# Patient Record
Sex: Female | Born: 1935 | Race: White | Hispanic: No | Smoking: Never smoker
Health system: Southern US, Community
[De-identification: ages and names within clinical notes are randomized; demographics above are authoritative.]

---

## 2018-11-08 ENCOUNTER — Emergency Department: Payer: Medicare Other

## 2018-11-08 ENCOUNTER — Inpatient Hospital Stay
Admission: EM | Admit: 2018-11-08 | Discharge: 2018-11-12 | DRG: 481 | Disposition: A | Discharge status: Home Health | Payer: Medicare Other | Attending: Internal Medicine | Admitting: Internal Medicine

## 2018-11-08 ENCOUNTER — Other Ambulatory Visit: Payer: Self-pay

## 2018-11-08 ENCOUNTER — Encounter: Payer: Self-pay | Admitting: *Deleted

## 2018-11-08 DIAGNOSIS — I10 Essential (primary) hypertension: Secondary | ICD-10-CM | POA: Diagnosis present

## 2018-11-08 DIAGNOSIS — F0390 Unspecified dementia without behavioral disturbance: Secondary | ICD-10-CM | POA: Diagnosis present

## 2018-11-08 DIAGNOSIS — D62 Acute posthemorrhagic anemia: Secondary | ICD-10-CM | POA: Diagnosis not present

## 2018-11-08 DIAGNOSIS — R739 Hyperglycemia, unspecified: Secondary | ICD-10-CM | POA: Diagnosis present

## 2018-11-08 DIAGNOSIS — Z9889 Other specified postprocedural states: Secondary | ICD-10-CM

## 2018-11-08 DIAGNOSIS — S72141A Displaced intertrochanteric fracture of right femur, initial encounter for closed fracture: Secondary | ICD-10-CM | POA: Diagnosis present

## 2018-11-08 DIAGNOSIS — Z8781 Personal history of (healed) traumatic fracture: Secondary | ICD-10-CM

## 2018-11-08 DIAGNOSIS — Z1159 Encounter for screening for other viral diseases: Secondary | ICD-10-CM

## 2018-11-08 DIAGNOSIS — W1830XA Fall on same level, unspecified, initial encounter: Secondary | ICD-10-CM | POA: Diagnosis present

## 2018-11-08 DIAGNOSIS — Z66 Do not resuscitate: Secondary | ICD-10-CM | POA: Diagnosis present

## 2018-11-08 DIAGNOSIS — W19XXXA Unspecified fall, initial encounter: Secondary | ICD-10-CM

## 2018-11-08 DIAGNOSIS — S72001A Fracture of unspecified part of neck of right femur, initial encounter for closed fracture: Secondary | ICD-10-CM

## 2018-11-08 DIAGNOSIS — R531 Weakness: Secondary | ICD-10-CM | POA: Diagnosis present

## 2018-11-08 DIAGNOSIS — Y92009 Unspecified place in unspecified non-institutional (private) residence as the place of occurrence of the external cause: Secondary | ICD-10-CM

## 2018-11-08 DIAGNOSIS — Z419 Encounter for procedure for purposes other than remedying health state, unspecified: Secondary | ICD-10-CM

## 2018-11-08 LAB — URINALYSIS, COMPLETE (UACMP) WITH MICROSCOPIC
Bacteria, UA: NONE SEEN
Bilirubin Urine: NEGATIVE
Glucose, UA: 150 mg/dL — AB
Hgb urine dipstick: NEGATIVE
Ketones, ur: 5 mg/dL — AB
Leukocytes,Ua: NEGATIVE
Nitrite: NEGATIVE
Protein, ur: NEGATIVE mg/dL
Specific Gravity, Urine: 1.013 (ref 1.005–1.030)
pH: 6 (ref 5.0–8.0)

## 2018-11-08 LAB — COMPREHENSIVE METABOLIC PANEL
ALT: 10 U/L (ref 0–44)
AST: 22 U/L (ref 15–41)
Albumin: 3.5 g/dL (ref 3.5–5.0)
Alkaline Phosphatase: 95 U/L (ref 38–126)
Anion gap: 8 (ref 5–15)
BUN: 19 mg/dL (ref 8–23)
CO2: 22 mmol/L (ref 22–32)
Calcium: 8.7 mg/dL — ABNORMAL LOW (ref 8.9–10.3)
Chloride: 109 mmol/L (ref 98–111)
Creatinine, Ser: 0.92 mg/dL (ref 0.44–1.00)
GFR calc Af Amer: >60 mL/min (ref >60)
GFR calc non Af Amer: 54 mL/min — ABNORMAL LOW (ref >60)
Glucose, Bld: 212 mg/dL — ABNORMAL HIGH (ref 70–99)
Potassium: 4.4 mmol/L (ref 3.5–5.1)
Sodium: 139 mmol/L (ref 135–145)
Total Bilirubin: 0.8 mg/dL (ref 0.3–1.2)
Total Protein: 6.9 g/dL (ref 6.5–8.1)

## 2018-11-08 LAB — CBC WITH DIFFERENTIAL/PLATELET
Abs Immature Granulocytes: 0.05 10*3/uL (ref 0.00–0.07)
Basophils Absolute: 0.1 10*3/uL (ref 0.0–0.1)
Basophils Relative: 2 %
Eosinophils Absolute: 0.1 10*3/uL (ref 0.0–0.5)
Eosinophils Relative: 1 %
HCT: 37.7 % (ref 36.0–46.0)
Hemoglobin: 12.1 g/dL (ref 12.0–15.0)
Immature Granulocytes: 1 %
Lymphocytes Relative: 19 %
Lymphs Abs: 1.4 10*3/uL (ref 0.7–4.0)
MCH: 31.4 pg (ref 26.0–34.0)
MCHC: 32.1 g/dL (ref 30.0–36.0)
MCV: 97.9 fL (ref 80.0–100.0)
Monocytes Absolute: 0.6 10*3/uL (ref 0.1–1.0)
Monocytes Relative: 8 %
Neutro Abs: 5.1 10*3/uL (ref 1.7–7.7)
Neutrophils Relative %: 69 %
Platelets: 191 10*3/uL (ref 150–400)
RBC: 3.85 MIL/uL — ABNORMAL LOW (ref 3.87–5.11)
RDW: 12.1 % (ref 11.5–15.5)
WBC: 7.3 10*3/uL (ref 4.0–10.5)
nRBC: 0 % (ref 0.0–0.2)

## 2018-11-08 LAB — SARS CORONAVIRUS 2 BY RT PCR (HOSPITAL ORDER, PERFORMED IN ~~LOC~~ HOSPITAL LAB): SARS Coronavirus 2: NEGATIVE

## 2018-11-08 MED ORDER — HYDROMORPHONE HCL 1 MG/ML IJ SOLN
1.0000 mg | INTRAMUSCULAR | Status: DC | PRN
  Administered 2018-11-08 – 2018-11-09 (×2): 1 mg via INTRAVENOUS
  Filled 2018-11-08: qty 1

## 2018-11-08 MED ORDER — ACETAMINOPHEN 650 MG RE SUPP: 650.0000 mg | Freq: Four times a day (QID) | RECTAL | Status: DC | PRN

## 2018-11-08 MED ORDER — ONDANSETRON HCL 4 MG/2ML IJ SOLN
4.0000 mg | Freq: Four times a day (QID) | INTRAMUSCULAR | Status: DC | PRN
  Administered 2018-11-08: 4 mg via INTRAVENOUS
  Filled 2018-11-08: qty 2

## 2018-11-08 MED ORDER — SODIUM CHLORIDE 0.9 % IV SOLN
INTRAVENOUS | Status: DC
  Administered 2018-11-08 – 2018-11-09 (×2): via INTRAVENOUS

## 2018-11-08 MED ORDER — TRAZODONE HCL 50 MG PO TABS: 25.0000 mg | ORAL_TABLET | Freq: Every evening | ORAL | Status: DC | PRN

## 2018-11-08 MED ORDER — ONDANSETRON HCL 4 MG PO TABS: 4.0000 mg | ORAL_TABLET | Freq: Four times a day (QID) | ORAL | Status: DC | PRN

## 2018-11-08 MED ORDER — ENOXAPARIN SODIUM 40 MG/0.4ML ~~LOC~~ SOLN
40.0000 mg | SUBCUTANEOUS | Status: DC
  Administered 2018-11-08: 40 mg via SUBCUTANEOUS
  Filled 2018-11-08: qty 0.4

## 2018-11-08 MED ORDER — CEFAZOLIN SODIUM-DEXTROSE 2-4 GM/100ML-% IV SOLN
2.0000 g | INTRAVENOUS | Status: AC
  Administered 2018-11-09: 2 g via INTRAVENOUS
  Filled 2018-11-08: qty 100

## 2018-11-08 MED ORDER — BISACODYL 5 MG PO TBEC: 5.0000 mg | DELAYED_RELEASE_TABLET | Freq: Every day | ORAL | Status: DC | PRN

## 2018-11-08 MED ORDER — HYDRALAZINE HCL 20 MG/ML IJ SOLN
10.0000 mg | Freq: Four times a day (QID) | INTRAMUSCULAR | Status: DC | PRN
  Administered 2018-11-08 – 2018-11-12 (×3): 10 mg via INTRAVENOUS
  Filled 2018-11-08 (×4): qty 1

## 2018-11-08 MED ORDER — ACETAMINOPHEN 325 MG PO TABS: 650.0000 mg | ORAL_TABLET | Freq: Four times a day (QID) | ORAL | Status: DC | PRN

## 2018-11-08 MED ORDER — HYDROMORPHONE HCL 1 MG/ML IJ SOLN
1.0000 mg | INTRAMUSCULAR | Status: DC | PRN
  Filled 2018-11-08: qty 1

## 2018-11-08 MED ORDER — DOCUSATE SODIUM 100 MG PO CAPS
100.0000 mg | ORAL_CAPSULE | Freq: Two times a day (BID) | ORAL | Status: DC
  Administered 2018-11-08: 100 mg via ORAL
  Filled 2018-11-08: qty 1

## 2018-11-08 NOTE — Consult Note (Signed)
ORTHOPAEDIC CONSULTATION  REQUESTING PHYSICIAN: Katha HammingKonidena, Snehalatha, MD  Chief Complaint: Right hip pain status post fall  HPI: Brittney Bean is a 85 y.o. female who complains of right hip pain status post fall at home.  Patient is reporting a mechanical fall at home prior to admission.  Her daughter found her soon after her fall and the patient was brought to [facility] department where x-rays demonstrated a right intertrochanteric hip fracture.  In her room this evening the patient denies any significant right hip pain.  She is having nausea/vomiting.  Her nurse is in her room and the patient is receiving Zofran.  History reviewed. No pertinent past medical history. History reviewed. No pertinent surgical history. Social History   Socioeconomic History  . Marital status: Widowed    Spouse name: Not on file  . Number of children: Not on file  . Years of education: Not on file  . Highest education level: Not on file  Occupational History  . Not on file  Social Needs  . Financial resource strain: Not on file  . Food insecurity    Worry: Not on file    Inability: Not on file  . Transportation needs    Medical: Not on file    Non-medical: Not on file  Tobacco Use  . Smoking status: Never Smoker  . Smokeless tobacco: Never Used  Substance and Sexual Activity  . Alcohol use: Not Currently  . Drug use: Not Currently  . Sexual activity: Not on file  Lifestyle  . Physical activity    Days per week: Not on file    Minutes per session: Not on file  . Stress: Not on file  Relationships  . Social Musicianconnections    Talks on phone: Not on file    Gets together: Not on file    Attends religious service: Not on file    Active member of club or organization: Not on file    Attends meetings of clubs or organizations: Not on file    Relationship status: Not on file  Other Topics Concern  . Not on file  Social History Narrative  . Not on file   No family history on  file. No Known Allergies Prior to Admission medications   Not on File   Dg Chest 1 View  Result Date: 11/08/2018 CLINICAL DATA:  Right hip pain after falling. EXAM: CHEST  1 VIEW COMPARISON:  None. FINDINGS: 1527 hours. The heart is mildly enlarged. There is aortic atherosclerosis. There is interstitial prominence in both lungs which could be chronic or reflect mild edema. There is no confluent airspace opacity, pleural effusion or pneumothorax. No acute fractures are identified in the chest. IMPRESSION: 1. Mild interstitial prominence in both lungs could reflect scarring or mild edema. Attention on follow-up recommended. 2. Cardiomegaly and aortic atherosclerosis. Electronically Signed   By: Carey BullocksWilliam  Veazey M.D.   On: 11/08/2018 15:57   Dg Hip Unilat W Or Wo Pelvis 2-3 Views Right  Result Date: 11/08/2018 CLINICAL DATA:  Right hip pain. EXAM: DG HIP (WITH OR WITHOUT PELVIS) 2-3V RIGHT COMPARISON:  No recent. FINDINGS: Right intertrochanteric comminuted angular hip fracture with prominent displaced fracture fragments including fractures of the right greater trochanter and lesser trochanter noted. Diffuse osteopenia degenerative changes lumbar spine and both hips. Peripheral vascular calcification. IMPRESSION: Right intertrochanteric comminuted and angulated hip fracture. Electronically Signed   By: Maisie Fushomas  Register   On: 11/08/2018 15:56    Positive ROS: All other systems have been  reviewed and were otherwise negative with the exception of those mentioned in the HPI and as above.  Physical Exam: General: Alert, no acute distress   MUSCULOSKELETAL: Right lower extremity: Patient skin is intact.  There is no significant erythema, ecchymosis or swelling.  Her thigh compartments are soft and compressible.  She has shortening and external rotation of the right lower extremity.  She has palpable pedal pulses, intact sensation light touch and intact motor function distally.  Assessment: Displaced,  comminuted right intratrochanteric hip fracture  Plan: I explained to the patient the nature of her injury.  I have recommended intramedullary fixation for her fracture.  At the patient's request I contacted her daughter, Brittney Bean, phone.  I explained her mother's fracture to her as well as the details of the operation and the postoperative course here in the hospital.  I also discussed the risks and benefits of surgery with the patient's daughter. She understands the risks include but are not limited to infection, bleeding requiring blood transfusion, nerve or blood vessel injury, joint stiffness or loss of motion, persistent pain, weakness or instability, malunion, nonunion and hardware failure and the need for further surgery. Medical risks include but are not limited to DVT and pulmonary embolism, myocardial infarction, stroke, pneumonia, respiratory failure and death.  Patient's daughter understood these risks and wished to proceed.  The patient was also in agreement with the plan for surgery.    Thornton Park, MD    11/08/2018 8:45 PM

## 2018-11-08 NOTE — ED Notes (Signed)
Pt alert.  Foley cath in place.  siderails x 2.  Sinus on monitor with pvc's.

## 2018-11-08 NOTE — ED Triage Notes (Signed)
Pt arrives to ED from home via [facility] with c/c of R hip pain. Pt states she stood from chair and turned and fell. Pt denies any LoC. EMS reports shortening and external rotation of R hip. Transport vitals: 249/93, p 96, O2 sat 97%. Pt states she is not on any medication and leaves her well being to the lord. Upon arrival, pt A&Ox4, NAD when laying still, no respiratory distress evident. Pt has 22G in Left hand, given 160mcg fentanyl enroute.

## 2018-11-08 NOTE — ED Notes (Signed)
ED TO INPATIENT HANDOFF REPORT  ED Nurse Name and Phone #: Nikita Humble   S Name/Age/Gender Brittney Bean 85 y.o. female Room/Bed: ED04A/ED04A  Code Status   Code Status: DNR  Home/SNF/Other Home Patient oriented to: self, place, time and situation Is this baseline? Yes   Triage Complete: Triage complete  Chief Complaint fall  Triage Note Pt arrives to ED from home via [facility] with c/c of R hip pain. Pt states she stood from chair and turned and fell. Pt denies any LoC. EMS reports shortening and external rotation of R hip. Transport vitals: 249/93, p 96, O2 sat 97%. Pt states she is not on any medication and leaves her well being to the lord. Upon arrival, pt A&Ox4, NAD when laying still, no respiratory distress evident. Pt has 22G in Left hand, given 100mcg fentanyl enroute.    Allergies Not on File  Level of Care/Admitting Diagnosis ED Disposition    ED Disposition Condition Comment   Admit  Hospital Area: [facility] [[id number]]  Level of Care: Med-Surg [16]  Covid Evaluation: Confirmed COVID Negative  Diagnosis: Closed right hip fracture [facility](HCC) [[id number]][[id number]]  Admitting Physician: Katha HammingKONIDENA, SNEHALATHA [[id number]][[id number]]  Attending Physician: Katha HammingKONIDENA, SNEHALATHA [phone][[id number]]  Estimated length of stay: past midnight tomorrow  Certification:: I certify this patient will need inpatient services for at least 2 midnights  PT Class (Do Not Modify): Inpatient [101]  PT Acc Code (Do Not Modify): Private [1]       B Medical/Surgery History History reviewed. No pertinent past medical history. History reviewed. No pertinent surgical history.   A IV Location/Drains/Wounds Patient Lines/Drains/Airways Status   Active Line/Drains/Airways    None          Intake/Output Last 24 hours No intake or output data in the 24 hours ending 11/08/18 1749  Labs/Imaging Results for orders placed or performed during the hospital encounter of 11/08/18 (from the past 48 hour(s))  CBC with  Differential/Platelet     Status: Abnormal   Collection Time: 11/08/18  3:56 PM  Result Value Ref Range   WBC 7.3 4.0 - 10.5 K/uL   RBC 3.85 (L) 3.87 - 5.11 MIL/uL   Hemoglobin 12.1 12.0 - 15.0 g/dL   HCT 78.237.7 95.636.0 - 21.346.0 %   MCV 97.9 80.0 - 100.0 fL   MCH 31.4 26.0 - 34.0 pg   MCHC 32.1 30.0 - 36.0 g/dL   RDW 08.612.1 57.811.5 - 46.915.5 %   Platelets 191 150 - 400 K/uL   nRBC 0.0 0.0 - 0.2 %   Neutrophils Relative % 69 %   Neutro Abs 5.1 1.7 - 7.7 K/uL   Lymphocytes Relative 19 %   Lymphs Abs 1.4 0.7 - 4.0 K/uL   Monocytes Relative 8 %   Monocytes Absolute 0.6 0.1 - 1.0 K/uL   Eosinophils Relative 1 %   Eosinophils Absolute 0.1 0.0 - 0.5 K/uL   Basophils Relative 2 %   Basophils Absolute 0.1 0.0 - 0.1 K/uL   Immature Granulocytes 1 %   Abs Immature Granulocytes 0.05 0.00 - 0.07 K/uL    Comment: Performed at [facility], [address] [place], [place] [address]  Comprehensive metabolic panel     Status: Abnormal   Collection Time: 11/08/18  3:56 PM  Result Value Ref Range   Sodium 139 135 - 145 mmol/L   Potassium 4.4 3.5 - 5.1 mmol/L    Comment: HEMOLYSIS AT THIS LEVEL MAY AFFECT RESULT   Chloride 109 98 - 111  mmol/L   CO2 22 22 - 32 mmol/L   Glucose, Bld 212 (H) 70 - 99 mg/dL   BUN 19 8 - 23 mg/dL   Creatinine, Ser 5.400.92 0.44 - 1.00 mg/dL   Calcium 8.7 (L) 8.9 - 10.3 mg/dL   Total Protein 6.9 6.5 - 8.1 g/dL   Albumin 3.5 3.5 - 5.0 g/dL   AST 22 15 - 41 U/L   ALT 10 0 - 44 U/L   Alkaline Phosphatase 95 38 - 126 U/L   Total Bilirubin 0.8 0.3 - 1.2 mg/dL   GFR calc non Af Amer 54 (L) >60 mL/min   GFR calc Af Amer >60 >60 mL/min   Anion gap 8 5 - 15    Comment: Performed at [facility], [address] [place], [place] [address]  Sample to Blood Bank     Status: None   Collection Time: 11/08/18  3:56 PM  Result Value Ref Range   Sample Expiration      11/11/2018,2359 Performed at [facility], [address] [place], [place] [address]    Urinalysis, Complete w Microscopic     Status: Abnormal   Collection Time: 11/08/18  3:57 PM  Result Value Ref Range   Color, Urine STRAW (A) YELLOW   APPearance CLEAR (A) CLEAR   Specific Gravity, Urine 1.013 1.005 - 1.030   pH 6.0 5.0 - 8.0   Glucose, UA 150 (A) NEGATIVE mg/dL   Hgb urine dipstick NEGATIVE NEGATIVE   Bilirubin Urine NEGATIVE NEGATIVE   Ketones, ur 5 (A) NEGATIVE mg/dL   Protein, ur NEGATIVE NEGATIVE mg/dL   Nitrite NEGATIVE NEGATIVE   Leukocytes,Ua NEGATIVE NEGATIVE   WBC, UA 0-5 0 - 5 WBC/hpf   Bacteria, UA NONE SEEN NONE SEEN   Squamous Epithelial / LPF 0-5 0 - 5   Mucus PRESENT     Comment: Performed at [facility], [address] [place], [place] [address]  SARS Coronavirus 2 (CEPHEID - Performed in [facility] hospital lab), Hosp Order     Status: None   Collection Time: 11/08/18  4:01 PM   Specimen: Nasopharyngeal Swab  Result Value Ref Range   SARS Coronavirus 2 NEGATIVE NEGATIVE    Comment: (NOTE) If result is NEGATIVE SARS-CoV-2 target nucleic acids are NOT DETECTED. The SARS-CoV-2 RNA is generally detectable in upper and lower  respiratory specimens during the acute phase of infection. The lowest  concentration of SARS-CoV-2 viral copies this assay can detect is 250  copies / mL. A negative result does not preclude SARS-CoV-2 infection  and should not be used as the sole basis for treatment or other  patient management decisions.  A negative result may occur with  improper specimen collection / handling, submission of specimen other  than nasopharyngeal swab, presence of viral mutation(s) within the  areas targeted by this assay, and inadequate number of viral copies  (<250 copies / mL). A negative result must be combined with clinical  observations, patient history, and epidemiological information. If result is POSITIVE SARS-CoV-2 target nucleic acids are DETECTED. The SARS-CoV-2 RNA is generally detectable in upper and lower   respiratory specimens dur ing the acute phase of infection.  Positive  results are indicative of active infection with SARS-CoV-2.  Clinical  correlation with patient history and other diagnostic information is  necessary to determine patient infection status.  Positive results do  not rule out bacterial infection or co-infection with other viruses. If result is PRESUMPTIVE POSTIVE SARS-CoV-2  nucleic acids MAY BE PRESENT.   A presumptive positive result was obtained on the submitted specimen  and confirmed on repeat testing.  While 2019 novel coronavirus  (SARS-CoV-2) nucleic acids may be present in the submitted sample  additional confirmatory testing may be necessary for epidemiological  and / or clinical management purposes  to differentiate between  SARS-CoV-2 and other Sarbecovirus currently known to infect humans.  If clinically indicated additional testing with an alternate test  methodology [phone]) is advised. The SARS-CoV-2 RNA is generally  detectable in upper and lower respiratory sp ecimens during the acute  phase of infection. The expected result is Negative. Fact Sheet for Patients:  StrictlyIdeas.no Fact Sheet for Healthcare Providers: BankingDealers.co.za This test is not yet approved or cleared by the [place] FDA and has been authorized for detection and/or diagnosis of SARS-CoV-2 by FDA under an Emergency Use Authorization (EUA).  This EUA will remain in effect (meaning this test can be used) for the duration of the COVID-19 declaration under Section 564(b)(1) of the Act, 21 U.S.C. section 360bbb-3(b)(1), unless the authorization is terminated or revoked sooner. Performed at [facility], [address] [place], [place] [address]    Dg Chest 1 View  Result Date: 11/08/2018 CLINICAL DATA:  Right hip pain after falling. EXAM: CHEST  1 VIEW COMPARISON:  None. FINDINGS: 1527 hours. The heart is mildly  enlarged. There is aortic atherosclerosis. There is interstitial prominence in both lungs which could be chronic or reflect mild edema. There is no confluent airspace opacity, pleural effusion or pneumothorax. No acute fractures are identified in the chest. IMPRESSION: 1. Mild interstitial prominence in both lungs could reflect scarring or mild edema. Attention on follow-up recommended. 2. Cardiomegaly and aortic atherosclerosis. Electronically Signed   By: Richardean Sale M.D.   On: 11/08/2018 15:57   Dg Hip Unilat W Or Wo Pelvis 2-3 Views Right  Result Date: 11/08/2018 CLINICAL DATA:  Right hip pain. EXAM: DG HIP (WITH OR WITHOUT PELVIS) 2-3V RIGHT COMPARISON:  No recent. FINDINGS: Right intertrochanteric comminuted angular hip fracture with prominent displaced fracture fragments including fractures of the right greater trochanter and lesser trochanter noted. Diffuse osteopenia degenerative changes lumbar spine and both hips. Peripheral vascular calcification. IMPRESSION: Right intertrochanteric comminuted and angulated hip fracture. Electronically Signed   By: Marcello Moores  Register   On: 11/08/2018 15:56    Pending Labs Unresulted Labs (From admission, onward)    Start     Ordered   11/15/18 0500  Creatinine, serum  (enoxaparin (LOVENOX)    CrCl >/= 30 ml/min)  Weekly,   STAT    Comments: while on enoxaparin therapy    11/08/18 1738   11/09/18 5003  Basic metabolic panel  Tomorrow morning,   STAT     11/08/18 1738   11/09/18 0500  CBC  Tomorrow morning,   STAT     11/08/18 1738   11/08/18 1733  CBC  (enoxaparin (LOVENOX)    CrCl >/= 30 ml/min)  Once,   STAT    Comments: Baseline for enoxaparin therapy IF NOT ALREADY DRAWN.  Notify MD if PLT < 100 K.    11/08/18 1738   11/08/18 1733  Creatinine, serum  (enoxaparin (LOVENOX)    CrCl >/= 30 ml/min)  Once,   STAT    Comments: Baseline for enoxaparin therapy IF NOT ALREADY DRAWN.    11/08/18 1738          Vitals/Pain Today's Vitals    11/08/18 1600 11/08/18 1620  11/08/18 1623 11/08/18 1700  BP: (!) 170/128 (!) 198/115  (!) 216/84  Pulse: 92 96 94 95  Resp: 16 15 12 15   SpO2: 100% 100% 99% 100%  Weight:      Height:        Isolation Precautions No active isolations  Medications Medications  0.9 %  sodium chloride infusion (has no administration in time range)  acetaminophen (TYLENOL) tablet 650 mg (has no administration in time range)    Or  acetaminophen (TYLENOL) suppository 650 mg (has no administration in time range)  traZODone (DESYREL) tablet 25 mg (has no administration in time range)  docusate sodium (COLACE) capsule 100 mg (has no administration in time range)  bisacodyl (DULCOLAX) EC tablet 5 mg (has no administration in time range)  ondansetron (ZOFRAN) tablet 4 mg (has no administration in time range)    Or  ondansetron (ZOFRAN) injection 4 mg (has no administration in time range)  enoxaparin (LOVENOX) injection 40 mg (has no administration in time range)  HYDROmorphone (DILAUDID) injection 1 mg (has no administration in time range)  hydrALAZINE (APRESOLINE) injection 10 mg (has no administration in time range)    Mobility walks High fall risk   Focused Assessments Cardiac Assessment Handoff:    No results found for: CKTOTAL, CKMB, CKMBINDEX, TROPONINI No results found for: DDIMER Does the Patient currently have chest pain? No      R Recommendations: See Admitting Provider Note  Report given to:   Additional Notes:none

## 2018-11-08 NOTE — ED Notes (Signed)
meds given for blood pressure/.  Pt alert

## 2018-11-08 NOTE — ED Provider Notes (Addendum)
[facility] Emergency Department Provider Note       Time seen: ----------------------------------------- 3:57 PM on 11/08/2018 -----------------------------------------   I have reviewed the triage vital signs and the nursing notes.  HISTORY   Chief Complaint Fall and Hip Pain   HPI Brittney Bean is a 85 y.o. female with unknown past medical history at this time who presents to the ED for right hip pain.  Patient states she stood up from a chair and turned and fell landing on the right hip.  She denies any head injury or loss of consciousness.  EMS reports shortening and external rotation of the right hip.  She arrives with a markedly elevated blood pressure.  Patient states she is not on any medication at this time.  She received fentanyl in route with pain control.  No past medical history on file.  There are no active problems to display for this patient.  Allergies Patient has no allergy information on record.  Social History Social History   Tobacco Use  . Smoking status: Not on file  Substance Use Topics  . Alcohol use: Not on file  . Drug use: Not on file   Review of Systems Constitutional: Negative for fever. Cardiovascular: Negative for chest pain. Respiratory: Negative for shortness of breath. Gastrointestinal: negative for abdominal pain, vomiting and diarrhea. Musculoskeletal: Positive for right hip pain Skin: Negative for rash. Neurological: Negative for headaches, focal weakness or numbness.  All systems negative/normal/unremarkable except as stated in the HPI  ____________________________________________   PHYSICAL EXAM:  VITAL SIGNS: ED Triage Vitals [11/08/18 1530]  Enc Vitals Group     BP      Pulse      Resp      Temp      Temp src      SpO2      Weight 200 lb (90.7 kg)     Height 5\' 4"  (1.626 m)     Head Circumference      Peak Flow      Pain Score      Pain Loc      Pain Edu?      Excl. in Echo?     Constitutional: Alert and oriented. Well appearing and in no distress. Eyes: Conjunctivae are normal. Normal extraocular movements. ENT      Head: Normocephalic and atraumatic.      Nose: No congestion/rhinnorhea.      Mouth/Throat: Mucous membranes are moist.      Neck: No stridor. Cardiovascular: Normal rate, regular rhythm. No murmurs, rubs, or gallops. Respiratory: Normal respiratory effort without tachypnea nor retractions. Breath sounds are clear and equal bilaterally. No wheezes/rales/rhonchi. Gastrointestinal: Soft and nontender. Normal bowel sounds Musculoskeletal: Right leg is shortened and externally rotated, pain with range of motion of the right hip Neurologic:  Normal speech and language. No gross focal neurologic deficits are appreciated.  Skin:  Skin is warm, dry and intact. No rash noted. Psychiatric: Mood and affect are normal. Speech and behavior are normal.  ____________________________________________  EKG: Interpreted by me.  Sinus rhythm with sinus arrhythmia, rate is 89 bpm, PVC, right bundle branch block, long QT  ____________________________________________  ED COURSE:  As part of my medical decision making, I reviewed the following data within the [place] History obtained from family if available, nursing notes, old chart and ekg, as well as notes from prior ED visits. Patient presented for a fall with right hip pain, we will assess with labs and  imaging as indicated at this time.   Procedures  Brittney Bean was evaluated in Emergency Department on 11/08/2018 for the symptoms described in the history of present illness. She was evaluated in the context of the global COVID-19 pandemic, which necessitated consideration that the patient might be at risk for infection with the SARS-CoV-2 virus that causes COVID-19. Institutional protocols and algorithms that pertain to the evaluation of patients at risk for COVID-19 are in a state of rapid change  based on information released by regulatory bodies including the CDC and federal and state organizations. These policies and algorithms were followed during the patient's care in the ED.  ____________________________________________   LABS (pertinent positives/negatives)  Labs Reviewed  CBC WITH DIFFERENTIAL/PLATELET - Abnormal; Notable for the following components:      Result Value   RBC 3.85 (*)    All other components within normal limits  COMPREHENSIVE METABOLIC PANEL - Abnormal; Notable for the following components:   Glucose, Bld 212 (*)    Calcium 8.7 (*)    GFR calc non Af Amer 54 (*)    All other components within normal limits  URINALYSIS, COMPLETE (UACMP) WITH MICROSCOPIC - Abnormal; Notable for the following components:   Color, Urine STRAW (*)    APPearance CLEAR (*)    Glucose, UA 150 (*)    Ketones, ur 5 (*)    All other components within normal limits  SARS CORONAVIRUS 2 (HOSPITAL ORDER, PERFORMED IN  HOSPITAL LAB)  SAMPLE TO BLOOD BANK    RADIOLOGY Images were viewed by me Right hip IMPRESSION: Right intertrochanteric comminuted and angulated hip fracture. IMPRESSION: 1. Mild interstitial prominence in both lungs could reflect scarring or mild edema. Attention on follow-up recommended. 2. Cardiomegaly and aortic atherosclerosis.  ____________________________________________   DIFFERENTIAL DIAGNOSIS   Fall, fracture, dislocation, hypertension  FINAL ASSESSMENT AND PLAN  Fall, right hip fracture   Plan: The patient had presented for a mechanical fall. Patient's labs do reveal some hyperglycemia but no other acute process.  She does likely appear to have untreated hypertension as well.  Patient's imaging does reveal a right intertrochanteric comminuted and angulated right hip fracture.  I will discuss with orthopedic surgery and the hospitalist service for admission.   Ulice DashJohnathan E Jamel Dunton, MD    Note: This note was generated in part or  whole with voice recognition software. Voice recognition is usually quite accurate but there are transcription errors that can and very often do occur. I apologize for any typographical errors that were not detected and corrected.     Emily FilbertWilliams, Lennyx Verdell E, MD 11/08/18 1619    Emily FilbertWilliams, Yaxiel Minnie E, MD 11/08/18 [phone]

## 2018-11-08 NOTE — ED Notes (Signed)
Patient transported to X-ray 

## 2018-11-08 NOTE — ED Notes (Signed)
md talking with family via phone now.

## 2018-11-08 NOTE — ED Notes (Signed)
Family with pt for admission.

## 2018-11-08 NOTE — ED Notes (Signed)
Report called to joy rn floor nurse 

## 2018-11-08 NOTE — H&P (Signed)
[facility] [place] NAME: Brittney Bean    MR#:  [id number]  DATE OF BIRTH:  19-Jul-1925  DATE OF ADMISSION:  11/08/2018  PRIMARY CARE PHYSICIAN: Patient, No Pcp Per   REQUESTING/REFERRING PHYSICIAN: Lenise Bean  CHIEF COMPLAINT: Fall   Chief Complaint  Patient presents with  . Fall  . Hip Pain    HISTORY OF PRESENT ILLNESS:  Brittney Bean  is a 85 y.o. female with no past medical history lives alone had a fall at home.  Patient states that she stood up from a chair and turned and fell on right hip, t x-ray of right hip showed a right hip fracture admitting for the same.  Patient is very upset she lost her daughter a week ago and very tearful.  Patient blood pressure was very elevated when she came.  Says that she has no PCP and said         ''Dr. Marcene Brawn  ' is her doctor.  Patient says that she uses wheelchair.  Has any recent fever or cough.  PAST MEDICAL HISTORY:  No past medical history on file.  Denies any past medical history  PAST SURGICAL HISTOIRY:     No previous surgeries.  SOCIAL HISTORY:   Social History   Tobacco Use  . Smoking status: Not on file  Substance Use Topics  . Alcohol use: Not on file    FAMILY HISTORY:  No family history on file.  No family history of diabetes, hypertension.  DRUG ALLERGIES:  Not on File No known drug allergies. REVIEW OF SYSTEMS:  CONSTITUTIONAL: No fever, fatigue or weakness.  She is very hard of hearing but able to answer questions appropriately EYES: No blurred or double vision.  EARS, NOSE, AND THROAT: No tinnitus or ear pain.  RESPIRATORY: No cough, shortness of breath, wheezing or hemoptysis.  CARDIOVASCULAR: No chest pain, orthopnea, edema.  GASTROINTESTINAL: No nausea, vomiting, diarrhea or abdominal pain.  GENITOURINARY: No dysuria, hematuria.  ENDOCRINE: No polyuria, nocturia,  HEMATOLOGY: No anemia, easy bruising or bleeding SKIN: No rash or  lesion. MUSCULOSKELETAL: Severe right hip pain. NEUROLOGIC: No tingling, numbness, weakness.  PSYCHIATRY: No anxiety or depression.   MEDICATIONS AT HOME:   Prior to Admission medications   Not on File      VITAL SIGNS:  Blood pressure (!) 216/84, pulse 95, resp. rate 15, height 5\' 4"  (1.626 m), weight 90.7 kg, SpO2 100 %.  PHYSICAL EXAMINATION:  GENERAL:  85 y.o.-year-old patient lying in the bed with no acute distress.  EYES: Pupils equal, round, reactive to light and accommodation. No scleral icterus. Extraocular muscles intact.  HEENT: Head atraumatic, normocephalic. Oropharynx and nasopharynx clear.  NECK:  Supple, no jugular venous distention. No thyroid enlargement, no tenderness.  LUNGS: Normal breath sounds bilaterally, no wheezing, rales,rhonchi or crepitation. No use of accessory muscles of respiration.  CARDIOVASCULAR: S1, S2 normal. No murmurs, rubs, or gallops.  ABDOMEN: Soft, nontender, nondistended. Bowel sounds present. No organomegaly or mass.  EXTREMITIES: N right hip externally rotated, tender to palpation on the right hip area.   NEUROLOGIC: Cranial nerves II through XII are intact. Muscle strength 5/5 in all extremities. Sensation intact. Gait not checked.  PSYCHIATRIC: The patient is alert and oriented x 3.  SKIN: No obvious rash, lesion, or ulcer.   LABORATORY PANEL:   CBC Recent Labs  Lab 11/08/18 1556  WBC 7.3  HGB 12.1  HCT 37.7  PLT 191   ------------------------------------------------------------------------------------------------------------------  Chemistries  Recent Labs  Lab 11/08/18 1556  NA 139  K 4.4  CL 109  CO2 22  GLUCOSE 212*  BUN 19  CREATININE 0.92  CALCIUM 8.7*  AST 22  ALT 10  ALKPHOS 95  BILITOT 0.8   ------------------------------------------------------------------------------------------------------------------  Cardiac Enzymes No results for input(s): TROPONINI in the last 168  hours. ------------------------------------------------------------------------------------------------------------------  RADIOLOGY:  Dg Chest 1 View  Result Date: 11/08/2018 CLINICAL DATA:  Right hip pain after falling. EXAM: CHEST  1 VIEW COMPARISON:  None. FINDINGS: 1527 hours. The heart is mildly enlarged. There is aortic atherosclerosis. There is interstitial prominence in both lungs which could be chronic or reflect mild edema. There is no confluent airspace opacity, pleural effusion or pneumothorax. No acute fractures are identified in the chest. IMPRESSION: 1. Mild interstitial prominence in both lungs could reflect scarring or mild edema. Attention on follow-up recommended. 2. Cardiomegaly and aortic atherosclerosis. Electronically Signed   By: Carey BullocksWilliam  Veazey M.D.   On: 11/08/2018 15:57   Dg Hip Unilat W Or Wo Pelvis 2-3 Views Right  Result Date: 11/08/2018 CLINICAL DATA:  Right hip pain. EXAM: DG HIP (WITH OR WITHOUT PELVIS) 2-3V RIGHT COMPARISON:  No recent. FINDINGS: Right intertrochanteric comminuted angular hip fracture with prominent displaced fracture fragments including fractures of the right greater trochanter and lesser trochanter noted. Diffuse osteopenia degenerative changes lumbar spine and both hips. Peripheral vascular calcification. IMPRESSION: Right intertrochanteric comminuted and angulated hip fracture. Electronically Signed   By: Maisie Fushomas  Register   On: 11/08/2018 15:56    EKG:   Orders placed or performed during the hospital encounter of 11/08/18  . ED EKG  . ED EKG  . EKG 12-Lead  . EKG 12-Lead  Sinus rhythm 89 bpm, ST-T changes.  IMPRESSION AND PLAN:  85 year old female with no significant past medical history and has no PCP comes in because of the fall and suffered right hip fracture.   #1. right intertrochanteric comminuted fracture initial encounter.  Admitted to MedSurg floor, continue pain management, DVT prophylaxis, consult orthopedics, Dr. Martha ClanKrasinski is  aware, patient is at moderate risk for surgery because of her advanced age but surgery can be performed. 2.  COVID-19 test has been negative. Start physical therapy as appropriate. #4 malignant hypertension, no history of high blood pressure before, patient is in a lot of pain and also has been going through stress as she lost her daughter recently, while n.p.o. we can use hydralazine, we can start Norvasc once surgery is done and can take p.o.     All the records are reviewed and case discussed with ED provider. Management plans discussed with the patient, family and they are in agreement.  CODE STATUS: DNR  TOTAL TIME TAKING CARE OF THIS PATIENT: 55 minutes.    Katha HammingSnehalatha Khiya Friese M.D on 11/08/2018 at 5:39 PM  Between 7am to 6pm - Pager - [phone]  After 6pm go to [email or web address] - password [email or web address]  [facility]io [facility]  Office  [phone]  CC: Primary care physician; Patient, No Pcp Per  Note: This dictation was prepared with Dragon dictation along with smaller phrase technology. Any transcriptional errors that result from this process are unintentional.

## 2018-11-09 ENCOUNTER — Inpatient Hospital Stay: Payer: Medicare Other | Admitting: Anesthesiology

## 2018-11-09 ENCOUNTER — Inpatient Hospital Stay: Payer: Medicare Other

## 2018-11-09 ENCOUNTER — Encounter
Admission: EM | Disposition: A | Discharge status: Home Health | Payer: Self-pay | Source: Home / Self Care | Attending: Internal Medicine

## 2018-11-09 ENCOUNTER — Encounter: Payer: Self-pay | Admitting: *Deleted

## 2018-11-09 HISTORY — PX: INTRAMEDULLARY (IM) NAIL INTERTROCHANTERIC: SHX5875

## 2018-11-09 LAB — BASIC METABOLIC PANEL
Anion gap: 6 (ref 5–15)
BUN: 22 mg/dL (ref 8–23)
CO2: 25 mmol/L (ref 22–32)
Calcium: 8.2 mg/dL — ABNORMAL LOW (ref 8.9–10.3)
Chloride: 108 mmol/L (ref 98–111)
Creatinine, Ser: 0.99 mg/dL (ref 0.44–1.00)
GFR calc Af Amer: 58 mL/min — ABNORMAL LOW (ref >60)
GFR calc non Af Amer: 50 mL/min — ABNORMAL LOW (ref >60)
Glucose, Bld: 177 mg/dL — ABNORMAL HIGH (ref 70–99)
Potassium: 4.2 mmol/L (ref 3.5–5.1)
Sodium: 139 mmol/L (ref 135–145)

## 2018-11-09 LAB — CBC
HCT: 28.8 % — ABNORMAL LOW (ref 36.0–46.0)
Hemoglobin: 9.4 g/dL — ABNORMAL LOW (ref 12.0–15.0)
MCH: 31.5 pg (ref 26.0–34.0)
MCHC: 32.6 g/dL (ref 30.0–36.0)
MCV: 96.6 fL (ref 80.0–100.0)
Platelets: 182 10*3/uL (ref 150–400)
RBC: 2.98 MIL/uL — ABNORMAL LOW (ref 3.87–5.11)
RDW: 12.3 % (ref 11.5–15.5)
WBC: 9.7 10*3/uL (ref 4.0–10.5)
nRBC: 0 % (ref 0.0–0.2)

## 2018-11-09 LAB — SURGICAL PCR SCREEN
MRSA, PCR: NEGATIVE
Staphylococcus aureus: NEGATIVE

## 2018-11-09 LAB — GLUCOSE, CAPILLARY: Glucose-Capillary: 153 mg/dL — ABNORMAL HIGH (ref 70–99)

## 2018-11-09 SURGERY — FIXATION, FRACTURE, INTERTROCHANTERIC, WITH INTRAMEDULLARY ROD
Anesthesia: Spinal | Laterality: Right

## 2018-11-09 MED ORDER — SODIUM CHLORIDE 0.9 % IR SOLN
Status: DC | PRN
  Administered 2018-11-09: 1000 mL

## 2018-11-09 MED ORDER — ENSURE ENLIVE PO LIQD
237.0000 mL | Freq: Two times a day (BID) | ORAL | Status: DC
  Administered 2018-11-11 – 2018-11-12 (×2): 237 mL via ORAL

## 2018-11-09 MED ORDER — BUPIVACAINE HCL (PF) 0.5 % IJ SOLN
INTRAMUSCULAR | Status: DC | PRN
  Administered 2018-11-09: 3 mL

## 2018-11-09 MED ORDER — FENTANYL CITRATE (PF) 100 MCG/2ML IJ SOLN: 25.0000 ug | INTRAMUSCULAR | Status: DC | PRN

## 2018-11-09 MED ORDER — FENTANYL CITRATE (PF) 100 MCG/2ML IJ SOLN
INTRAMUSCULAR | Status: AC
  Filled 2018-11-09: qty 2

## 2018-11-09 MED ORDER — PROPOFOL 10 MG/ML IV BOLUS
INTRAVENOUS | Status: DC | PRN
  Administered 2018-11-09 (×3): 10 mg via INTRAVENOUS

## 2018-11-09 MED ORDER — ONDANSETRON HCL 4 MG/2ML IJ SOLN: 4.0000 mg | Freq: Four times a day (QID) | INTRAMUSCULAR | Status: DC | PRN

## 2018-11-09 MED ORDER — SODIUM CHLORIDE 0.9 % IV SOLN
INTRAVENOUS | Status: DC | PRN
  Administered 2018-11-09: 15:00:00 25 ug/min via INTRAVENOUS

## 2018-11-09 MED ORDER — CEFAZOLIN SODIUM-DEXTROSE 2-4 GM/100ML-% IV SOLN
INTRAVENOUS | Status: AC
  Filled 2018-11-09: qty 100

## 2018-11-09 MED ORDER — BISACODYL 10 MG RE SUPP
10.0000 mg | Freq: Every day | RECTAL | Status: DC | PRN
  Administered 2018-11-11: 10 mg via RECTAL
  Filled 2018-11-09: qty 1

## 2018-11-09 MED ORDER — ACETAMINOPHEN 500 MG PO TABS
500.0000 mg | ORAL_TABLET | Freq: Four times a day (QID) | ORAL | Status: AC
  Administered 2018-11-09 – 2018-11-10 (×3): 500 mg via ORAL
  Filled 2018-11-09 (×3): qty 1

## 2018-11-09 MED ORDER — POTASSIUM CHLORIDE IN NACL 20-0.9 MEQ/L-% IV SOLN
INTRAVENOUS | Status: DC
  Administered 2018-11-09: 19:00:00 via INTRAVENOUS
  Filled 2018-11-09 (×5): qty 1000

## 2018-11-09 MED ORDER — MORPHINE SULFATE (PF) 2 MG/ML IV SOLN: 0.5000 mg | INTRAVENOUS | Status: DC | PRN

## 2018-11-09 MED ORDER — KETAMINE HCL 50 MG/ML IJ SOLN
INTRAMUSCULAR | Status: AC
  Filled 2018-11-09: qty 10

## 2018-11-09 MED ORDER — ACETAMINOPHEN 325 MG PO TABS
325.0000 mg | ORAL_TABLET | Freq: Four times a day (QID) | ORAL | Status: DC | PRN
  Administered 2018-11-11: 650 mg via ORAL
  Filled 2018-11-09: qty 2

## 2018-11-09 MED ORDER — ONDANSETRON HCL 4 MG PO TABS: 4.0000 mg | ORAL_TABLET | Freq: Four times a day (QID) | ORAL | Status: DC | PRN

## 2018-11-09 MED ORDER — POLYETHYLENE GLYCOL 3350 17 G PO PACK: 17.0000 g | PACK | Freq: Every day | ORAL | Status: DC | PRN

## 2018-11-09 MED ORDER — KETOROLAC TROMETHAMINE 15 MG/ML IJ SOLN
7.5000 mg | Freq: Four times a day (QID) | INTRAMUSCULAR | Status: DC
  Administered 2018-11-09 (×2): 7.5 mg via INTRAVENOUS
  Filled 2018-11-09 (×3): qty 1

## 2018-11-09 MED ORDER — FERROUS SULFATE 325 (65 FE) MG PO TABS
325.0000 mg | ORAL_TABLET | Freq: Three times a day (TID) | ORAL | Status: DC
  Administered 2018-11-12: 325 mg via ORAL
  Filled 2018-11-09 (×3): qty 1

## 2018-11-09 MED ORDER — LIDOCAINE HCL (PF) 2 % IJ SOLN
INTRAMUSCULAR | Status: AC
  Filled 2018-11-09: qty 10

## 2018-11-09 MED ORDER — PROPOFOL 500 MG/50ML IV EMUL
INTRAVENOUS | Status: AC
  Filled 2018-11-09: qty 50

## 2018-11-09 MED ORDER — GENTAMICIN SULFATE 40 MG/ML IJ SOLN
INTRAMUSCULAR | Status: AC
  Filled 2018-11-09: qty 2

## 2018-11-09 MED ORDER — MAGNESIUM CITRATE PO SOLN
1.0000 | Freq: Once | ORAL | Status: DC | PRN
  Filled 2018-11-09: qty 296

## 2018-11-09 MED ORDER — PHENYLEPHRINE HCL (PRESSORS) 10 MG/ML IV SOLN
INTRAVENOUS | Status: DC | PRN
  Administered 2018-11-09: 200 ug via INTRAVENOUS

## 2018-11-09 MED ORDER — HYDROCODONE-ACETAMINOPHEN 7.5-325 MG PO TABS: 1.0000 | ORAL_TABLET | ORAL | Status: DC | PRN

## 2018-11-09 MED ORDER — ALUM & MAG HYDROXIDE-SIMETH 200-200-20 MG/5ML PO SUSP: 30.0000 mL | ORAL | Status: DC | PRN

## 2018-11-09 MED ORDER — LIDOCAINE HCL (CARDIAC) PF 100 MG/5ML IV SOSY
PREFILLED_SYRINGE | INTRAVENOUS | Status: DC | PRN
  Administered 2018-11-09: 50 mg via INTRAVENOUS

## 2018-11-09 MED ORDER — HYDROCODONE-ACETAMINOPHEN 5-325 MG PO TABS
1.0000 | ORAL_TABLET | ORAL | Status: DC | PRN
  Administered 2018-11-09: 1 via ORAL
  Filled 2018-11-09: qty 1

## 2018-11-09 MED ORDER — METHOCARBAMOL 500 MG PO TABS
500.0000 mg | ORAL_TABLET | Freq: Four times a day (QID) | ORAL | Status: DC | PRN
  Filled 2018-11-09: qty 1

## 2018-11-09 MED ORDER — METHOCARBAMOL 1000 MG/10ML IJ SOLN
500.0000 mg | Freq: Four times a day (QID) | INTRAVENOUS | Status: DC | PRN
  Filled 2018-11-09: qty 5

## 2018-11-09 MED ORDER — TRAMADOL HCL 50 MG PO TABS
50.0000 mg | ORAL_TABLET | Freq: Four times a day (QID) | ORAL | Status: DC
  Administered 2018-11-09 (×2): 50 mg via ORAL
  Filled 2018-11-09 (×3): qty 1

## 2018-11-09 MED ORDER — ENOXAPARIN SODIUM 40 MG/0.4ML ~~LOC~~ SOLN
40.0000 mg | SUBCUTANEOUS | Status: DC
  Administered 2018-11-10 – 2018-11-12 (×3): 40 mg via SUBCUTANEOUS
  Filled 2018-11-09 (×3): qty 0.4

## 2018-11-09 MED ORDER — FENTANYL CITRATE (PF) 100 MCG/2ML IJ SOLN
INTRAMUSCULAR | Status: DC | PRN
  Administered 2018-11-09 (×2): 25 ug via INTRAVENOUS

## 2018-11-09 MED ORDER — DOCUSATE SODIUM 100 MG PO CAPS
100.0000 mg | ORAL_CAPSULE | Freq: Two times a day (BID) | ORAL | Status: DC
  Administered 2018-11-09 – 2018-11-11 (×3): 100 mg via ORAL
  Filled 2018-11-09 (×5): qty 1

## 2018-11-09 MED ORDER — KETAMINE HCL 50 MG/ML IJ SOLN
INTRAMUSCULAR | Status: DC | PRN
  Administered 2018-11-09: 50 mg via INTRAMUSCULAR

## 2018-11-09 MED ORDER — PROPOFOL 500 MG/50ML IV EMUL
INTRAVENOUS | Status: DC | PRN
  Administered 2018-11-09: 25 ug/kg/min via INTRAVENOUS

## 2018-11-09 MED ORDER — CEFAZOLIN SODIUM-DEXTROSE 1-4 GM/50ML-% IV SOLN
1.0000 g | Freq: Four times a day (QID) | INTRAVENOUS | Status: AC
  Administered 2018-11-09 – 2018-11-10 (×2): 1 g via INTRAVENOUS
  Filled 2018-11-09 (×2): qty 50

## 2018-11-09 SURGICAL SUPPLY — 42 items
BIT DRILL 4.3MMS DISTAL GRDTED (BIT) IMPLANT
BNDG COHESIVE 6X5 TAN STRL LF (GAUZE/BANDAGES/DRESSINGS) ×6 IMPLANT
CANISTER SUCT 1200ML W/VALVE (MISCELLANEOUS) ×3 IMPLANT
COVER WAND RF STERILE (DRAPES) ×3 IMPLANT
CRADLE LAMINECT ARM (MISCELLANEOUS) ×3 IMPLANT
DRAPE SHEET LG 3/4 BI-LAMINATE (DRAPES) ×6 IMPLANT
DRAPE SURG 17X11 SM STRL (DRAPES) ×6 IMPLANT
DRAPE U-SHAPE 47X51 STRL (DRAPES) ×3 IMPLANT
DRILL 4.3MMS DISTAL GRADUATED (BIT) ×6
DRSG OPSITE POSTOP 3X4 (GAUZE/BANDAGES/DRESSINGS) ×6 IMPLANT
DRSG OPSITE POSTOP 4X14 (GAUZE/BANDAGES/DRESSINGS) IMPLANT
DRSG OPSITE POSTOP 4X6 (GAUZE/BANDAGES/DRESSINGS) ×3 IMPLANT
DRSG TEGADERM 4X4.75 (GAUZE/BANDAGES/DRESSINGS) ×2 IMPLANT
DURAPREP 26ML APPLICATOR (WOUND CARE) ×6 IMPLANT
ELECT REM PT RETURN 9FT ADLT (ELECTROSURGICAL) ×3
ELECTRODE REM PT RTRN 9FT ADLT (ELECTROSURGICAL) ×1 IMPLANT
GLOVE BIOGEL PI IND STRL 9 (GLOVE) ×1 IMPLANT
GLOVE BIOGEL PI INDICATOR 9 (GLOVE) ×2
GLOVE SURG 9.0 ORTHO LTXF (GLOVE) ×6 IMPLANT
GOWN STRL REUS TWL 2XL XL LVL4 (GOWN DISPOSABLE) ×3 IMPLANT
GOWN STRL REUS W/ TWL LRG LVL3 (GOWN DISPOSABLE) ×1 IMPLANT
GOWN STRL REUS W/TWL LRG LVL3 (GOWN DISPOSABLE) ×2
GUIDEPIN VERSANAIL DSP 3.2X444 (ORTHOPEDIC DISPOSABLE SUPPLIES) ×2 IMPLANT
GUIDEWIRE BALL NOSE 80CM (WIRE) ×2 IMPLANT
HEMOVAC 400CC 10FR (MISCELLANEOUS) IMPLANT
HFN LAG SCREW 10.5MM X 115MM (Orthopedic Implant) ×2 IMPLANT
HFN RH 130 DEG 11MM X 360MM (Orthopedic Implant) ×2 IMPLANT
KIT TURNOVER CYSTO (KITS) ×3 IMPLANT
MAT ABSORB  FLUID 56X50 GRAY (MISCELLANEOUS) ×2
MAT ABSORB FLUID 56X50 GRAY (MISCELLANEOUS) ×1 IMPLANT
NS IRRIG 1000ML POUR BTL (IV SOLUTION) ×3 IMPLANT
PACK HIP COMPR (MISCELLANEOUS) ×3 IMPLANT
SCREW BONE CORTICAL 5.0X42 (Screw) ×2 IMPLANT
SCREW BONE CORTICAL 5.0X44 (Screw) ×2 IMPLANT
STAPLER SKIN PROX 35W (STAPLE) ×3 IMPLANT
SUCTION FRAZIER HANDLE 10FR (MISCELLANEOUS) ×2
SUCTION TUBE FRAZIER 10FR DISP (MISCELLANEOUS) ×1 IMPLANT
SUT VIC AB 0 CT1 36 (SUTURE) ×8 IMPLANT
SUT VIC AB 2-0 CT1 27 (SUTURE) ×2
SUT VIC AB 2-0 CT1 TAPERPNT 27 (SUTURE) ×1 IMPLANT
SUT VICRYL 0 AB UR-6 (SUTURE) ×3 IMPLANT
SYR 30ML LL (SYRINGE) ×3 IMPLANT

## 2018-11-09 NOTE — Transfer of Care (Signed)
Immediate Anesthesia Transfer of Care Note  Patient: Edla Para  Procedure(s) Performed: INTRAMEDULLARY (IM) NAIL INTERTROCHANTRIC (Right )  Patient Location: PACU  Anesthesia Type:Spinal  Level of Consciousness: awake, alert  and oriented  Airway & Oxygen Therapy: Patient Spontanous Breathing and Patient connected to nasal cannula oxygen  Post-op Assessment: Report given to RN and Post -op Vital signs reviewed and stable  Post vital signs: Reviewed and stable  Last Vitals:  Vitals Value Taken Time  BP    Temp    Pulse    Resp    SpO2      Last Pain:  Vitals:   11/09/18 1247  TempSrc: Oral  PainSc:          Complications: No apparent anesthesia complications

## 2018-11-09 NOTE — TOC Progression Note (Signed)
Transition of Care [facility]) - Progression Note    Patient Details  Name: Brittney Bean MRN: [id number] Date of Birth: 06-01-1925  [facility] CM/SW Contact  Su Hilt, RN Phone Number: 11/09/2018, 11:25 AM  Clinical Narrative:    Called to speak to the patient's daughter [name], left a detailed VM requesting a call back         Expected Discharge Plan and Services                                                 Social Determinants of Health (SDOH) Interventions    Readmission Risk Interventions No flowsheet data found.

## 2018-11-09 NOTE — NC FL2 (Signed)
[place] LEVEL OF CARE SCREENING TOOL     IDENTIFICATION  Patient Name: Brittney Bean Birthdate: [birth year] Sex: female Admission Date (Current Location): 11/08/2018  [facility] [place] and [place] Number:  Engineering geologist and Address:         Provider Number: [id number]  Attending Physician Name and Address:  Vaughan Basta, *  Relative Name and Phone Number:       Current Level of Care: Hospital Recommended Level of Care: [place] Prior Approval Number:    Date Approved/Denied:   PASRR Number: [id number]  Discharge Plan: SNF    Current Diagnoses: Patient Active Problem List   Diagnosis Date Noted  . Closed right hip fracture ([place]) 11/08/2018    Orientation RESPIRATION BLADDER Height & Weight     Self, Time, Situation, Place  Normal Incontinent Weight: 200 lb (90.7 kg) Height:  5\' 4"  (162.6 cm)  BEHAVIORAL SYMPTOMS/MOOD NEUROLOGICAL BOWEL NUTRITION STATUS      Continent Diet(Diet: NPO for surgery to be advanced.)  AMBULATORY STATUS COMMUNICATION OF NEEDS Skin   Extensive Assist Verbally Surgical wounds                       Personal Care Assistance Level of Assistance  Bathing, Feeding, Dressing Bathing Assistance: Limited assistance Feeding assistance: Limited assistance Dressing Assistance: Limited assistance     Functional Limitations Info  Sight, Hearing, Speech Sight Info: Adequate Hearing Info: Adequate Speech Info: Adequate    SPECIAL CARE FACTORS FREQUENCY  PT (By licensed PT), OT (By licensed OT)     PT Frequency: 5 OT Frequency: 5            Contractures      Additional Factors Info  Code Status, Allergies Code Status Info: DNR Allergies Info: No Known Allergies.           Current Medications (11/09/2018):  This is the current hospital active medication list Current Facility-Administered Medications  Medication Dose Route Frequency Provider Last Rate Last Dose  . 0.9 %  sodium chloride  infusion   Intravenous Continuous Epifanio Lesches, MD 75 mL/hr at 11/08/18 2124    . acetaminophen (TYLENOL) tablet 650 mg  650 mg Oral Q6H PRN Epifanio Lesches, MD       Or  . acetaminophen (TYLENOL) suppository 650 mg  650 mg Rectal Q6H PRN Epifanio Lesches, MD      . bisacodyl (DULCOLAX) EC tablet 5 mg  5 mg Oral Daily PRN Epifanio Lesches, MD      . ceFAZolin (ANCEF) IVPB 2g/100 mL premix  2 g Intravenous 30 min Pre-Op [place], MD      . docusate sodium (COLACE) capsule 100 mg  100 mg Oral BID Epifanio Lesches, MD   100 mg at 11/08/18 2041  . hydrALAZINE (APRESOLINE) injection 10 mg  10 mg Intravenous Q6H PRN Epifanio Lesches, MD   10 mg at 11/08/18 1752  . HYDROmorphone (DILAUDID) injection 1 mg  1 mg Intravenous Q4H PRN Epifanio Lesches, MD   1 mg at 11/09/18 0402  . ondansetron (ZOFRAN) tablet 4 mg  4 mg Oral Q6H PRN Epifanio Lesches, MD       Or  . ondansetron (ZOFRAN) injection 4 mg  4 mg Intravenous Q6H PRN Epifanio Lesches, MD   4 mg at 11/08/18 2038  . traZODone (DESYREL) tablet 25 mg  25 mg Oral QHS PRN Epifanio Lesches, MD         Discharge Medications: Please see discharge  summary for a list of discharge medications.  Relevant Imaging Results:  Relevant Lab Results:   Additional Information SSN: [id number]  Merriel Zinger, Darleen CrockerBailey M, LCSW

## 2018-11-09 NOTE — TOC Initial Note (Signed)
Transition of Care [facility](TOC) - Initial/Assessment Note    Patient Details  Name: Brittney Bean MRN: [id number] Date of Birth: 16-Sep-1925  Transition of Care [facility](TOC) CM/SW Contact:    Barrie Dunkereliliah J Maegen Wigle, RN Phone Number: 11/09/2018, 1:54 PM  Clinical Narrative:                 Spoke with the patient's daughter today Brittney Bean at [phone] The patient lives with her daughter. Brittney Bean She has a RW and can and [facility] but will need Hospital bed, I notified the Doctor The patient has 3 nieces and a grand daughter that work in the medical field as a CNA, the patient will have 24 hour care [facility], I [facility] [name] [facility] Expected Discharge Plan: Home w Home Health Services Barriers to Discharge: Continued Medical Work up   Patient Goals and CMS Choice Patient states their goals for this hospitalization and ongoing recovery are:: to go home per daughter CMS Medicare.gov Compare Post Acute Care list provided to:: Patient Represenative (must comment)(daughter Brittney Bean) Choice offered to / list presented to : Adult Children  Expected Discharge Plan and Services Expected Discharge Plan: Home w Home Health Services   Discharge Planning Services: CM Consult Post Acute Care Choice: Home Health Living arrangements for the past 2 months: Single Family Home                 DME Arranged: Hospital bed DME Agency: [employer] Date DME Agency Contacted: 11/09/18 Time DME Agency Contacted: 1353 Representative spoke with at DME Agency: Nida BoatmanBrad HH Arranged: PT, Nurse's Aide, RN HH Agency: [facility] Date HH Agency Contacted: 11/09/18 Time HH Agency Contacted: 1353 [facility] Agency: [name]  Prior Living Arrangements/Services Living arrangements for the past 2 months: Single Family Home Lives with:: Adult Children Patient language and need for interpreter reviewed:: No        Need for Family Participation in Patient Care: No  (Comment) Care giver support system in place?: Yes (comment) Current home services: [facility] Criminal Activity/Legal Involvement Pertinent to Current Situation/Hospitalization: No - Comment as needed  Activities of Daily Living Home Assistive Devices/Equipment: Dentures (specify type), Walker (specify type) ADL Screening (condition at time of admission) Patient's cognitive ability adequate to safely complete daily activities?: Yes Is the patient deaf or have difficulty hearing?: No Does the patient have difficulty seeing, even when wearing glasses/contacts?: No Does the patient have difficulty concentrating, remembering, or making decisions?: No Patient able to express need for assistance with ADLs?: Yes Does the patient have difficulty dressing or bathing?: Yes Independently performs ADLs?: No Grooming: Needs assistance Is this a change from baseline?: Pre-admission baseline Feeding: Independent Bathing: Needs assistance Is this a change from baseline?: Pre-admission baseline Toileting: Needs assistance Is this a change from baseline?: Pre-admission baseline In/Out Bed: Independent Walks in Home: Independent with device (comment) Does the patient have difficulty walking or climbing stairs?: Yes Weakness of Legs: Both Weakness of Arms/Hands: None  Permission Sought/Granted   Permission granted to share information with : Yes, Verbal Permission Granted              Emotional Assessment Appearance:: Appears stated age Attitude/Demeanor/Rapport: Inconsistent Affect (typically observed): Accepting Orientation: : Oriented to Self, Oriented to Place Alcohol / Substance Use: Not Applicable Psych Involvement: No (comment)  Admission diagnosis:  Fall, initial encounter [W19.XXXA] Closed right hip fracture, initial encounter [facility](HCC) [S72.001A] Patient Active Problem List   Diagnosis Date  Noted  . Closed right hip fracture ([place]) 11/08/2018   PCP:  Patient, No Pcp  Per Pharmacy:   [id number] - [place], [place] - [address] [place] [address] Phone: [phone] Fax: [phone]     Social Determinants of Health (SDOH) Interventions    Readmission Risk Interventions No flowsheet data found.

## 2018-11-09 NOTE — TOC Benefit Eligibility Note (Signed)
Transition of Care [facility]) Benefit Eligibility Note    Patient Details  Name: Brittney Bean MRN: [id number] Date of Birth: 1926-04-20   Medication/Dose: Lovenox 40 mg daily x 14 days                       Additional Notes: No Medicare Card on file and shows pt. only has Part A.  Tried checking through [employer] but no Medicare number available.    [place] Phone Number: 11/09/2018, 1:35 PM

## 2018-11-09 NOTE — Progress Notes (Signed)
Initial Nutrition Assessment  DOCUMENTATION CODES:   Obesity unspecified  INTERVENTION:  Provide Ensure Enlive po BID with diet advancement, each supplement provides 350 kcal and 20 grams of protein.   NUTRITION DIAGNOSIS:   Increased nutrient needs related to hip fracture, post-op healing as evidenced by estimated needs.  GOAL:   Patient will meet greater than or equal to 90% of their needs  MONITOR:   Diet advancement, PO intake, Supplement acceptance, Labs, Weight trends, Skin, I & O's  REASON FOR ASSESSMENT:   Malnutrition Screening Tool    ASSESSMENT:   85 year old female with no PMHx admitted after fall found to have right intertrochanteric fracture.   Met with patient in room. She was very confused and unable to provide any history. She kept saying that her mind went blank and she kept talking into phone asking for help (phone was off). Patient is NPO with plans to go to OR today. Skin is intact.  There is no weight history in chart to trend.  Medications reviewed and include: Colace 100 mg BID, NS at 75 mL/hr, cefazolin.  Labs reviewed: CBG 153.  Patient does not meet criteria for malnutrition at this time. However, unable to obtain nutrition and weight history. Patient will have increased needs for healing following operation.  NUTRITION - FOCUSED PHYSICAL EXAM:    Most Recent Value  Orbital Region  No depletion  Upper Arm Region  No depletion  Thoracic and Lumbar Region  No depletion  Buccal Region  No depletion  Temple Region  No depletion  Clavicle Bone Region  No depletion  Clavicle and Acromion Bone Region  Mild depletion  Scapular Bone Region  No depletion  Dorsal Hand  No depletion  Patellar Region  No depletion  Anterior Thigh Region  No depletion  Posterior Calf Region  Unable to assess  Edema (RD Assessment)  Mild  Hair  Reviewed  Eyes  Reviewed  Mouth  Reviewed  Skin  Reviewed  Nails  Reviewed     Diet Order:   Diet Order            Diet NPO time specified  Diet effective midnight             EDUCATION NEEDS:   No education needs have been identified at this time  Skin:  Skin Assessment: Reviewed RN Assessment  Last BM:  Unknown  Height:   Ht Readings from Last 1 Encounters:  11/08/18 _0  (1.626 m)   Weight:   Wt Readings from Last 1 Encounters:  11/08/18 90.7 kg   Ideal Body Weight:  54.5 kg  BMI:  Body mass index is 34.33 kg/m.  Estimated Nutritional Needs:   Kcal:  1800-2000  Protein:  90-100 grams  Fluid:  1.8-2 L/day  Willey Blade, MS, RD, LDN Office: [phone] Pager: [phone] After Hours/Weekend Pager: [phone]

## 2018-11-09 NOTE — Op Note (Signed)
11/09/2018  5:32 PM  PATIENT:  Brittney Bean    PRE-OPERATIVE DIAGNOSIS: Right comminuted intertrochanteric hip fracture  POST-OPERATIVE DIAGNOSIS:  Same  PROCEDURE:  INTRAMEDULLARY (IM) NAIL INTERTROCHANTRIC  SURGEON:  Thornton Park, MD  ANESTHESIA:   Spinal  EBL: 150 cc  IMPLANT:  ZIMMER BIOMET AFFIXUS NAIL 371mm x 92mm with a 115 mm lag screw and distal interlocking screws 44 mm and 42 mm in length.  PREOPERATIVE INDICATIONS:  Brittney Bean is a  85 y.o. female with a diagnosis of a displaced, comminuted right intertrochanteric hip fracture.  I recommended intramedullary fixation for this fracture.  I explained to the patient and her daughter, by phone, the details of this operation and the postoperative course.  I explained to them the risks and benefits of surgery.  The risks, benefits and alternatives were discussed with the patient and their family.  The risks include but are not limited to infection, bleeding requiring blood transfusion, nerve or blood vessel injury, malunion, nonunion, hardware prominence, hardware failure, leg length discrepancy or change in lower extremity rotation and need for further surgery including hardware removal with conversion to a total hip arthroplasty. Medical risks include but are not limited to DVT and pulmonary embolism, myocardial infarction, stroke, pneumonia, respiratory failure and death. The patient and her daughter understood these risks and wished to proceed with surgery.  OPERATIVE PROCEDURE:  The patient was brought to the operating room and placed in the supine position on the fracture table. The patient received spinal anesthesia.  A closed reduction was performed under C-arm guidance.  The patient had posterior displacement of the shaft of the femur in relation to the proximal portion of the fracture.  A crutch was required to reduce the femoral shaft.  The fracture reduction was confirmed on both AP and lateral views. After adequate  reduction was achieved, a time out was performed to verify the patient's name, date of birth, medical record number, correct site of surgery correct procedure to be performed. The timeout was also used to verify the patient received antibiotics and all appropriate instruments, implants and radiographic studies were available in the room. Once all in attendance were in agreement, the case began. The patient was prepped and draped in a sterile fashion.  She received received preoperative antibiotics with 2 g of Ancef.  An incision was made proximal to the greater trochanter in line with the femur. A guidewire was placed over the tip of the greater trochanter and advanced by drill into the proximal femur to the level of the lesser trochanter.  Confirmation of the drill pin position was made on AP and lateral C-arm images.  The threaded guidepin was then overdrilled with the proximal femoral entry reamer.  A ball-tipped guidewire was then advanced down the intramedullary canal, across the fracture, and down the femoral shaft to the knee.  The ball tip guidewire's position was confirmed at both the knee and hip via C-arm imaging. A depth gauge was used to measure the length of the long nail to be used. It was measured to be 360 mm. The actual nail was then inserted into the proximal femur, across the fracture site and down the femoral shaft. Its position was confirmed on AP and lateral C-arm images.  The ball tip guidewire was removed.  Once the nail was completely seated, the drill guide for the lag screw was placed through the guide arm for the Affixus nail. A guidepin was then placed through this drill guide and advanced through  the lateral cortex of the femur, across the fracture site and into the femoral head achieving a tip apex distance of less than 25 mm. The length of the drill pin was measured to 115 mm.   The drill for the lag screw was advanced through the lateral cortex, across the fracture site and up  into the femoral head to the depth of the lag screw..  The lag screw was then advanced by hand into position across the fracture site into the femoral head. Its final position was confirmed on AP and lateral C-arm images. Compression was applied as traction was carefully released. The set screw in the top of the intramedullary rod was tightened by hand using a screwdriver. It was backed off a quarter turn to allow for compression at the fracture site.  The attention was then turned to placement of the distal interlocking screws. A perfect circle technique was used. 2 small stab incisions were made over the distal interlocking screw holes.  A free hand technique was used to drill both distal interlocking screws. The depth of the screw holes was measured with a depth gauge. The 44mm and 42mm screws were then advanced into position and tightened by hand. Final C-arm images of the entire intramedullary construct were taken in both the AP and lateral planes.   The wounds were irrigated copiously and closed with 0 Vicryl for closure of the deep fascia and 2-0 Vicryl for subcutaneous closure. The skin was approximated with staples. A dry sterile dressing was applied. I was scrubbed and present the entire case and all sharp, sponge and instrument counts were correct at the conclusion of the case. Patient was transferred to a hospital bed and brought to PACU in stable condition.   I spoke with the patient's daughter via phone, given COVID-19 restrictions, to let her know the case had been performed without complication and that her mother was stable in the recovery room.    Kathreen DevoidKevin L Samanda Buske, MD

## 2018-11-09 NOTE — TOC Progression Note (Signed)
Transition of Care [facility]) - Progression Note    Patient Details  Name: Brittney Bean MRN: [id number] Date of Birth: 1925/07/03  [facility]) CM/SW [place], RN Phone Number: 11/09/2018, 11:28 AM  Clinical Narrative:     Requested the price of lovenox, will notify the daughter once obtained       Expected Discharge Plan and Services                                                 Social Determinants of Health (SDOH) Interventions    Readmission Risk Interventions No flowsheet data found.

## 2018-11-09 NOTE — Anesthesia Post-op Follow-up Note (Signed)
Anesthesia QCDR form completed.        

## 2018-11-09 NOTE — Anesthesia Preprocedure Evaluation (Addendum)
Anesthesia Evaluation  Patient identified by MRN, date of birth, ID band Patient awake    Reviewed: Allergy & Precautions, H&P , NPO status , Patient's Chart, lab work & pertinent test results  Airway Mallampati: II  TM Distance: >3 FB Neck ROM: full    Dental  (+) Edentulous Lower, Edentulous Upper   Pulmonary neg pulmonary ROS, neg COPD,           Cardiovascular (-) angina(-) Past MI, (-) Cardiac Stents and (-) CABG negative cardio ROS  (-) dysrhythmias      Neuro/Psych CVA (memory problems, right-sided weakness), Residual Symptoms negative psych ROS   GI/Hepatic negative GI ROS, Neg liver ROS,   Endo/Other  negative endocrine ROS  Renal/GU      Musculoskeletal   Abdominal   Peds  Hematology negative hematology ROS (+)   Anesthesia Other Findings Right eye ptosis  History reviewed. No pertinent past medical history.  History reviewed. No pertinent surgical history.  BMI    Body Mass Index: 34.33 kg/m      Reproductive/Obstetrics negative OB ROS                            Anesthesia Physical Anesthesia Plan  ASA: II  Anesthesia Plan: Spinal   Post-op Pain Management:    Induction:   PONV Risk Score and Plan: Propofol infusion  Airway Management Planned: Simple Face Mask and Natural Airway  Additional Equipment:   Intra-op Plan:   Post-operative Plan:   Informed Consent: I have reviewed the patients History and Physical, chart, labs and discussed the procedure including the risks, benefits and alternatives for the proposed anesthesia with the patient or authorized representative who has indicated his/her understanding and acceptance.   Patient has DNR.  Discussed DNR with patient and Continue DNR.   Dental Advisory Given  Plan Discussed with: Anesthesiologist and CRNA  Anesthesia Plan Comments: (Pt is ok with intubation but does not want shock or chest  compressions under any circumstances.  KLF)      Anesthesia Quick Evaluation

## 2018-11-09 NOTE — Progress Notes (Signed)
Family Meeting Note  Advance Directive:YES  Today a meeting took place with the patient Pt is Alert,Awake ,Oriented and says she doesnot want CPR and Shocks,says "If its time for me to go ,let me go ,good lord will take care,"and  Wanted to be DNR   The following were discussed:Patient's diagnosis: , Patient's progosis: Additional follow-up to be provided: Pt wanted to go home after surgery if possible ,she says she lives alone and wants to go home if possible.  Time spent during discussion:16 min  Epifanio Lesches, MD

## 2018-11-09 NOTE — Plan of Care (Signed)

## 2018-11-09 NOTE — Progress Notes (Signed)
[employer] [facility] [place] [facility]   PATIENT NAME: Brittney Bean    MR#:  [id number]  DATE OF BIRTH:  1926-02-29  SUBJECTIVE:  CHIEF COMPLAINT:   Chief Complaint  Patient presents with  . Fall  . Hip Pain   Lives home alone, came after the fall and hip fracture.  Seen in the morning when she was complaining of pain in the hip but did not had any other complaint and was fully alert and oriented.  REVIEW OF SYSTEMS:  CONSTITUTIONAL: No fever, fatigue or weakness.  EYES: No blurred or double vision.  EARS, NOSE, AND THROAT: No tinnitus or ear pain.  RESPIRATORY: No cough, shortness of breath, wheezing or hemoptysis.  CARDIOVASCULAR: No chest pain, orthopnea, edema.  GASTROINTESTINAL: No nausea, vomiting, diarrhea or abdominal pain.  GENITOURINARY: No dysuria, hematuria.  ENDOCRINE: No polyuria, nocturia,  HEMATOLOGY: No anemia, easy bruising or bleeding SKIN: No rash or lesion. MUSCULOSKELETAL: Right hip pain.Marland Kitchen.   NEUROLOGIC: No tingling, numbness, weakness.  PSYCHIATRY: No anxiety or depression.   ROS  DRUG ALLERGIES:  No Known Allergies  VITALS:  Blood pressure (!) 181/66, pulse (!) 104, temperature 97.9 F (36.6 C), temperature source Oral, resp. rate 18, height 5\' 4"  (1.626 m), weight 90.7 kg, SpO2 100 %.  PHYSICAL EXAMINATION:  GENERAL:  85 y.o.-year-old patient lying in the bed with no acute distress.  EYES: Pupils equal, round, reactive to light and accommodation. No scleral icterus. Extraocular muscles intact.  HEENT: Head atraumatic, normocephalic. Oropharynx and nasopharynx clear.  NECK:  Supple, no jugular venous distention. No thyroid enlargement, no tenderness.  LUNGS: Normal breath sounds bilaterally, no wheezing, rales,rhonchi or crepitation. No use of accessory muscles of respiration.  CARDIOVASCULAR: S1, S2 normal. No murmurs, rubs, or gallops.  ABDOMEN: Soft, nontender, nondistended. Bowel sounds present. No organomegaly or mass.   EXTREMITIES: No pedal edema, cyanosis, or clubbing.  NEUROLOGIC: Cranial nerves II through XII are intact. Muscle strength 3-4/5 in all extremities except for the right lower extremity where she has pain on movement. Sensation intact. Gait not checked.  PSYCHIATRIC: The patient is alert and oriented x 3.  SKIN: No obvious rash, lesion, or ulcer.   Physical Exam LABORATORY PANEL:   CBC Recent Labs  Lab 11/09/18 0529  WBC 9.7  HGB 9.4*  HCT 28.8*  PLT 182   ------------------------------------------------------------------------------------------------------------------  Chemistries  Recent Labs  Lab 11/08/18 1556 11/09/18 0529  NA 139 139  K 4.4 4.2  CL 109 108  CO2 22 25  GLUCOSE 212* 177*  BUN 19 22  CREATININE 0.92 0.99  CALCIUM 8.7* 8.2*  AST 22  --   ALT 10  --   ALKPHOS 95  --   BILITOT 0.8  --    ------------------------------------------------------------------------------------------------------------------  Cardiac Enzymes No results for input(s): TROPONINI in the last 168 hours. ------------------------------------------------------------------------------------------------------------------  RADIOLOGY:  Dg Chest 1 View  Result Date: 11/08/2018 CLINICAL DATA:  Right hip pain after falling. EXAM: CHEST  1 VIEW COMPARISON:  None. FINDINGS: 1527 hours. The heart is mildly enlarged. There is aortic atherosclerosis. There is interstitial prominence in both lungs which could be chronic or reflect mild edema. There is no confluent airspace opacity, pleural effusion or pneumothorax. No acute fractures are identified in the chest. IMPRESSION: 1. Mild interstitial prominence in both lungs could reflect scarring or mild edema. Attention on follow-up recommended. 2. Cardiomegaly and aortic atherosclerosis. Electronically Signed   By: Carey BullocksWilliam  Veazey M.D.   On: 11/08/2018 15:57  Dg Hip Unilat W Or Wo Pelvis 2-3 Views Right  Result Date: 11/08/2018 CLINICAL DATA:  Right  hip pain. EXAM: DG HIP (WITH OR WITHOUT PELVIS) 2-3V RIGHT COMPARISON:  No recent. FINDINGS: Right intertrochanteric comminuted angular hip fracture with prominent displaced fracture fragments including fractures of the right greater trochanter and lesser trochanter noted. Diffuse osteopenia degenerative changes lumbar spine and both hips. Peripheral vascular calcification. IMPRESSION: Right intertrochanteric comminuted and angulated hip fracture. Electronically Signed   By: Marcello Moores  Register   On: 11/08/2018 15:56    ASSESSMENT AND PLAN:   Active Problems:   Closed right hip fracture ([place])  85 year old female with no significant past medical history and has no PCP comes in because of the fall and suffered right hip fracture.   #1. right intertrochanteric comminuted fracture initial encounter.   continue pain management, DVT prophylaxis, consult orthopedics, Dr. Mack Guise  Plan for surgery today. 2.  COVID-19 test has been negative. Start physical therapy as appropriate. #4 malignant hypertension, no history of high blood pressure before, patient is in a lot of pain and also has been going through stress as she lost her daughter recently, while n.p.o. we can use hydralazine, we can start Norvasc once surgery is done and can take p.o.   I spoke to patient's daughter on phone, she would like to take the patient home with her after the surgery is done and when she is ready for discharge as they helped the granddaughters who are CNA and they would be living with her and can take care of her with instead of sending to rehab.   All the records are reviewed and case discussed with Care Management/Social Workerr. Management plans discussed with the patient, family and they are in agreement.  CODE STATUS: DNR  TOTAL TIME TAKING CARE OF THIS PATIENT: 35 minutes.     POSSIBLE D/C IN 1-2 DAYS, DEPENDING ON CLINICAL CONDITION.   Vaughan Basta M.D on 11/09/2018   Between 7am to 6pm -  Pager - [phone]  After 6pm go to [email or web address] - password [email or web address] [facility]  Office  [phone]  CC: Primary care physician; Patient, No Pcp Per  Note: This dictation was prepared with Dragon dictation along with smaller phrase technology. Any transcriptional errors that result from this process are unintentional.

## 2018-11-09 NOTE — Progress Notes (Cosign Needed)
Patient has a right hip fracture which requires her body  to be positioned in ways  not feasible with a normal bed. Head must be elevated at least 30 degrees or     It causes more undue pain and could cause problems with the fracture surgery. The patient  requires      Frequent changes in body position which cannot be achieved with a normal bed.

## 2018-11-09 NOTE — Anesthesia Procedure Notes (Signed)
Date/Time: 11/09/2018 2:30 PM Performed by: Johnna Acosta, CRNA Pre-anesthesia Checklist: Patient identified, Emergency Drugs available, Suction available, Patient being monitored and Timeout performed Patient Re-evaluated:Patient Re-evaluated prior to induction Oxygen Delivery Method: Simple face mask Preoxygenation: Pre-oxygenation with 100% oxygen

## 2018-11-09 NOTE — Anesthesia Procedure Notes (Signed)
Spinal  Patient location during procedure: OR Start time: 11/09/2018 2:37 PM End time: 11/09/2018 2:44 PM Staffing Anesthesiologist: Durenda Hurt, MD Resident/CRNA: Johnna Acosta, CRNA Performed: resident/CRNA  Preanesthetic Checklist Completed: patient identified, site marked, surgical consent, pre-op evaluation, timeout performed, IV checked, risks and benefits discussed and monitors and equipment checked Spinal Block Patient position: left lateral decubitus Prep: ChloraPrep Patient monitoring: heart rate, continuous pulse ox, blood pressure and cardiac monitor Approach: midline Location: L3-4 Injection technique: single-shot Needle Needle type: Whitacre and Introducer  Needle gauge: 24 G Needle length: 9 cm Assessment Sensory level: T10 Additional Notes Negative paresthesia. Negative blood return. Positive free-flowing CSF. Expiration date of kit checked and confirmed. Patient tolerated procedure well, without complications.

## 2018-11-10 ENCOUNTER — Encounter: Payer: Self-pay | Admitting: Orthopedic Surgery

## 2018-11-10 ENCOUNTER — Other Ambulatory Visit: Payer: Self-pay

## 2018-11-10 LAB — BASIC METABOLIC PANEL
Anion gap: 4 — ABNORMAL LOW (ref 5–15)
BUN: 24 mg/dL — ABNORMAL HIGH (ref 8–23)
CO2: 23 mmol/L (ref 22–32)
Calcium: 7.8 mg/dL — ABNORMAL LOW (ref 8.9–10.3)
Chloride: 112 mmol/L — ABNORMAL HIGH (ref 98–111)
Creatinine, Ser: 0.91 mg/dL (ref 0.44–1.00)
GFR calc Af Amer: >60 mL/min (ref >60)
GFR calc non Af Amer: 55 mL/min — ABNORMAL LOW (ref >60)
Glucose, Bld: 139 mg/dL — ABNORMAL HIGH (ref 70–99)
Potassium: 4.2 mmol/L (ref 3.5–5.1)
Sodium: 139 mmol/L (ref 135–145)

## 2018-11-10 LAB — CBC
HCT: 24.6 % — ABNORMAL LOW (ref 36.0–46.0)
Hemoglobin: 7.8 g/dL — ABNORMAL LOW (ref 12.0–15.0)
MCH: 31 pg (ref 26.0–34.0)
MCHC: 31.7 g/dL (ref 30.0–36.0)
MCV: 97.6 fL (ref 80.0–100.0)
Platelets: 142 10*3/uL — ABNORMAL LOW (ref 150–400)
RBC: 2.52 MIL/uL — ABNORMAL LOW (ref 3.87–5.11)
RDW: 12.5 % (ref 11.5–15.5)
WBC: 7.7 10*3/uL (ref 4.0–10.5)
nRBC: 0 % (ref 0.0–0.2)

## 2018-11-10 LAB — GLUCOSE, CAPILLARY
Glucose-Capillary: 139 mg/dL — ABNORMAL HIGH (ref 70–99)
Glucose-Capillary: 111 mg/dL — ABNORMAL HIGH (ref 70–99)
Glucose-Capillary: 128 mg/dL — ABNORMAL HIGH (ref 70–99)
Glucose-Capillary: 161 mg/dL — ABNORMAL HIGH (ref 70–99)

## 2018-11-10 MED ORDER — SODIUM CHLORIDE 0.9 % IV SOLN
INTRAVENOUS | Status: DC
  Administered 2018-11-10 – 2018-11-11 (×2): via INTRAVENOUS

## 2018-11-10 MED ORDER — HYDROCODONE-ACETAMINOPHEN 5-325 MG PO TABS: 1.0000 | ORAL_TABLET | Freq: Four times a day (QID) | ORAL | Status: DC | PRN

## 2018-11-10 MED ORDER — CALCIUM CARBONATE-VITAMIN D 500-200 MG-UNIT PO TABS
1.0000 | ORAL_TABLET | Freq: Two times a day (BID) | ORAL | Status: DC
  Administered 2018-11-10 – 2018-11-12 (×3): 1 via ORAL
  Filled 2018-11-10 (×5): qty 1

## 2018-11-10 MED ORDER — TRAMADOL HCL 50 MG PO TABS
50.0000 mg | ORAL_TABLET | Freq: Four times a day (QID) | ORAL | Status: DC | PRN
  Administered 2018-11-11 – 2018-11-12 (×3): 50 mg via ORAL
  Filled 2018-11-10 (×3): qty 1

## 2018-11-10 NOTE — Progress Notes (Signed)
Subjective:  POD #1 s/p IM fixation for right IT hip fracture.   Patient is very drowsy.  She is arousable momentarily but falls back to sleep.  She is unable to provide a history or follow commands on exam.  Objective:   VITALS:   Vitals:   11/10/18 0424 11/10/18 0506 11/10/18 0830 11/10/18 1218  BP: (!) 123/48 (!) 138/55 91/73 (!) 141/54  Pulse: 81 93 84 95  Resp:   16   Temp:   98.5 F (36.9 C) 98 F (36.7 C)  TempSrc:   Axillary Oral  SpO2:  98% 96% 94%  Weight:  103 kg    Height:        PHYSICAL EXAM: Right lower extremity: Thigh compartments are soft and compressible. Intact pulses distally Incision: dressing C/D/I and moderate drainage from proximal incision. No cellulitis present Compartment soft Cannot assess sensation or motor function as the patient is unable to cooperate with exam.  LABS  Results for orders placed or performed during the hospital encounter of 11/08/18 (from the past 24 hour(s))  CBC     Status: Abnormal   Collection Time: 11/10/18  4:48 AM  Result Value Ref Range   WBC 7.7 4.0 - 10.5 K/uL   RBC 2.52 (L) 3.87 - 5.11 MIL/uL   Hemoglobin 7.8 (L) 12.0 - 15.0 g/dL   HCT 24.6 (L) 36.0 - 46.0 %   MCV 97.6 80.0 - 100.0 fL   MCH 31.0 26.0 - 34.0 pg   MCHC 31.7 30.0 - 36.0 g/dL   RDW 12.5 11.5 - 15.5 %   Platelets 142 (L) 150 - 400 K/uL   nRBC 0.0 0.0 - 0.2 %  Basic metabolic panel     Status: Abnormal   Collection Time: 11/10/18  4:48 AM  Result Value Ref Range   Sodium 139 135 - 145 mmol/L   Potassium 4.2 3.5 - 5.1 mmol/L   Chloride 112 (H) 98 - 111 mmol/L   CO2 23 22 - 32 mmol/L   Glucose, Bld 139 (H) 70 - 99 mg/dL   BUN 24 (H) 8 - 23 mg/dL   Creatinine, Ser 0.91 0.44 - 1.00 mg/dL   Calcium 7.8 (L) 8.9 - 10.3 mg/dL   GFR calc non Af Amer 55 (L) >60 mL/min   GFR calc Af Amer >60 >60 mL/min   Anion gap 4 (L) 5 - 15  Glucose, capillary     Status: Abnormal   Collection Time: 11/10/18  8:24 AM  Result Value Ref Range   Glucose-Capillary 139 (H) 70 - 99 mg/dL  Glucose, capillary     Status: Abnormal   Collection Time: 11/10/18 11:47 AM  Result Value Ref Range   Glucose-Capillary 111 (H) 70 - 99 mg/dL    Dg Chest 1 View  Result Date: 11/08/2018 CLINICAL DATA:  Right hip pain after falling. EXAM: CHEST  1 VIEW COMPARISON:  None. FINDINGS: 1527 hours. The heart is mildly enlarged. There is aortic atherosclerosis. There is interstitial prominence in both lungs which could be chronic or reflect mild edema. There is no confluent airspace opacity, pleural effusion or pneumothorax. No acute fractures are identified in the chest. IMPRESSION: 1. Mild interstitial prominence in both lungs could reflect scarring or mild edema. Attention on follow-up recommended. 2. Cardiomegaly and aortic atherosclerosis. Electronically Signed   By: Richardean Sale M.D.   On: 11/08/2018 15:57   Dg Hip Operative Unilat W Or W/o Pelvis Right  Result Date: 11/09/2018 CLINICAL DATA:  Intramedullary nail  to the right femur EXAM: OPERATIVE right HIP (WITH PELVIS IF PERFORMED) 31 VIEWS TECHNIQUE: Fluoroscopic spot image(s) were submitted for interpretation post-operatively. COMPARISON:  None. FINDINGS: Thirty-one fluoroscopic images were obtained during placement of an intramedullary nail into the right femur. Fluoroscopy time was reported as 279 seconds. IMPRESSION: Intraoperative fluoroscopy. Electronically Signed   By: Deatra RobinsonKevin  Herman M.D.   On: 11/09/2018 17:27   Dg Hip Unilat W Or Wo Pelvis 2-3 Views Right  Result Date: 11/08/2018 CLINICAL DATA:  Right hip pain. EXAM: DG HIP (WITH OR WITHOUT PELVIS) 2-3V RIGHT COMPARISON:  No recent. FINDINGS: Right intertrochanteric comminuted angular hip fracture with prominent displaced fracture fragments including fractures of the right greater trochanter and lesser trochanter noted. Diffuse osteopenia degenerative changes lumbar spine and both hips. Peripheral vascular calcification. IMPRESSION: Right  intertrochanteric comminuted and angulated hip fracture. Electronically Signed   By: Maisie Fushomas  Register   On: 11/08/2018 15:56   Dg Femur Port, Min 2 Views Right  Result Date: 11/09/2018 CLINICAL DATA:  Status post right proximal femur fracture ORIF. EXAM: RIGHT FEMUR PORTABLE 2 VIEW COMPARISON:  11/08/2018. FINDINGS: The intertrochanteric fracture has been reduced with a compression screw and a long intramedullary rod. Primary fracture components are in near anatomic alignment. The orthopedic hardware is well-seated. No evidence of an acute fracture or operative complication. IMPRESSION: Well aligned right femur intertrochanteric fracture following ORIF. Electronically Signed   By: Amie Portlandavid  Ormond M.D.   On: 11/09/2018 17:53    Assessment/Plan: 1 Day Post-Op   Active Problems:   Closed right hip fracture (H[facility])  Continue physical therapy as patient can tolerate.  Foley catheter removed.  Patient has completed 24 hours of postop antibiotics.  Continue to monitor mental status.  Vital signs are stable.  Hemoglobin 7.8 today white count 7.7.    Juanell FairlyKevin Chella Chapdelaine , MD 11/10/2018, 1:41 PM

## 2018-11-10 NOTE — Progress Notes (Signed)
[employer] [facility] [place] [facility]   PATIENT NAME: Brittney Bean    MR#:  [id number]  DATE OF BIRTH:  02-22-1926  SUBJECTIVE:  CHIEF COMPLAINT:   Chief Complaint  Patient presents with  . Fall  . Hip Pain   Lives home alone, came after the fall and hip fracture.   Status post surgery 11/09/18 Patient was drowsy this morning due to pain and sedating medications use last night. She was arousable but easily going back to sleep.  REVIEW OF SYSTEMS:  CONSTITUTIONAL: No fever, fatigue or weakness.  EYES: No blurred or double vision.  EARS, NOSE, AND THROAT: No tinnitus or ear pain.  RESPIRATORY: No cough, shortness of breath, wheezing or hemoptysis.  CARDIOVASCULAR: No chest pain, orthopnea, edema.  GASTROINTESTINAL: No nausea, vomiting, diarrhea or abdominal pain.  GENITOURINARY: No dysuria, hematuria.  ENDOCRINE: No polyuria, nocturia,  HEMATOLOGY: No anemia, easy bruising or bleeding SKIN: No rash or lesion. MUSCULOSKELETAL: Right hip pain.Marland Kitchen.   NEUROLOGIC: No tingling, numbness, weakness.  PSYCHIATRY: No anxiety or depression.   ROS  DRUG ALLERGIES:  No Known Allergies  VITALS:  Blood pressure (!) 141/54, pulse 95, temperature 98 F (36.7 C), temperature source Oral, resp. rate 16, height 5\' 4"  (1.626 m), weight 103 kg, SpO2 94 %.  PHYSICAL EXAMINATION:  GENERAL:  85 y.o.-year-old patient lying in the bed with no acute distress.  EYES: Pupils equal, round, reactive to light and accommodation. No scleral icterus. Extraocular muscles intact.  HEENT: Head atraumatic, normocephalic. Oropharynx and nasopharynx clear.  NECK:  Supple, no jugular venous distention. No thyroid enlargement, no tenderness.  LUNGS: Normal breath sounds bilaterally, no wheezing, rales,rhonchi or crepitation. No use of accessory muscles of respiration.  CARDIOVASCULAR: S1, S2 normal. No murmurs, rubs, or gallops.  ABDOMEN: Soft, nontender, nondistended. Bowel sounds present. No  organomegaly or mass.  EXTREMITIES: No pedal edema, cyanosis, or clubbing.  NEUROLOGIC: Cranial nerves II through XII are intact. Muscle strength 3-4/5 in all extremities except for the right lower extremity where she has pain on movement. Sensation intact. Gait not checked.  PSYCHIATRIC: The patient is drowsy but arousable.  SKIN: No obvious rash, lesion, or ulcer.   Physical Exam LABORATORY PANEL:   CBC Recent Labs  Lab 11/10/18 0448  WBC 7.7  HGB 7.8*  HCT 24.6*  PLT 142*   ------------------------------------------------------------------------------------------------------------------  Chemistries  Recent Labs  Lab 11/08/18 1556  11/10/18 0448  NA 139   < > 139  K 4.4   < > 4.2  CL 109   < > 112*  CO2 22   < > 23  GLUCOSE 212*   < > 139*  BUN 19   < > 24*  CREATININE 0.92   < > 0.91  CALCIUM 8.7*   < > 7.8*  AST 22  --   --   ALT 10  --   --   ALKPHOS 95  --   --   BILITOT 0.8  --   --    < > = values in this interval not displayed.   ------------------------------------------------------------------------------------------------------------------  Cardiac Enzymes No results for input(s): TROPONINI in the last 168 hours. ------------------------------------------------------------------------------------------------------------------  RADIOLOGY:  Dg Chest 1 View  Result Date: 11/08/2018 CLINICAL DATA:  Right hip pain after falling. EXAM: CHEST  1 VIEW COMPARISON:  None. FINDINGS: 1527 hours. The heart is mildly enlarged. There is aortic atherosclerosis. There is interstitial prominence in both lungs which could be chronic or reflect mild edema. There is  no confluent airspace opacity, pleural effusion or pneumothorax. No acute fractures are identified in the chest. IMPRESSION: 1. Mild interstitial prominence in both lungs could reflect scarring or mild edema. Attention on follow-up recommended. 2. Cardiomegaly and aortic atherosclerosis. Electronically Signed   By:  Richardean Sale M.D.   On: 11/08/2018 15:57   Dg Hip Operative Unilat W Or W/o Pelvis Right  Result Date: 11/09/2018 CLINICAL DATA:  Intramedullary nail to the right femur EXAM: OPERATIVE right HIP (WITH PELVIS IF PERFORMED) 31 VIEWS TECHNIQUE: Fluoroscopic spot image(s) were submitted for interpretation post-operatively. COMPARISON:  None. FINDINGS: Thirty-one fluoroscopic images were obtained during placement of an intramedullary nail into the right femur. Fluoroscopy time was reported as 279 seconds. IMPRESSION: Intraoperative fluoroscopy. Electronically Signed   By: Ulyses Jarred M.D.   On: 11/09/2018 17:27   Dg Hip Unilat W Or Wo Pelvis 2-3 Views Right  Result Date: 11/08/2018 CLINICAL DATA:  Right hip pain. EXAM: DG HIP (WITH OR WITHOUT PELVIS) 2-3V RIGHT COMPARISON:  No recent. FINDINGS: Right intertrochanteric comminuted angular hip fracture with prominent displaced fracture fragments including fractures of the right greater trochanter and lesser trochanter noted. Diffuse osteopenia degenerative changes lumbar spine and both hips. Peripheral vascular calcification. IMPRESSION: Right intertrochanteric comminuted and angulated hip fracture. Electronically Signed   By: Marcello Moores  Register   On: 11/08/2018 15:56   Dg Femur Port, Min 2 Views Right  Result Date: 11/09/2018 CLINICAL DATA:  Status post right proximal femur fracture ORIF. EXAM: RIGHT FEMUR PORTABLE 2 VIEW COMPARISON:  11/08/2018. FINDINGS: The intertrochanteric fracture has been reduced with a compression screw and a long intramedullary rod. Primary fracture components are in near anatomic alignment. The orthopedic hardware is well-seated. No evidence of an acute fracture or operative complication. IMPRESSION: Well aligned right femur intertrochanteric fracture following ORIF. Electronically Signed   By: Lajean Manes M.D.   On: 11/09/2018 17:53    ASSESSMENT AND PLAN:   Active Problems:   Closed right hip fracture [facility])  85 year old  female with no significant past medical history and has no PCP comes in because of the fall and suffered right hip fracture.   #1. right intertrochanteric comminuted fracture initial encounter.   continue pain management, DVT prophylaxis, consult orthopedics, Dr. Mack Guise  Status post surgery 11/09/18  #2  Acute blood loss anemia Monitor hemoglobin, currently no transfusion needed.  We will give iron supplements orally.  #4 malignant hypertension, no history of high blood pressure before,  Hydralazine IV currently, will continue to monitor after stopping the IV fluid if she need to be on oral antihypertensive medications then we will start before discharge.  I spoke to patient's daughter on phone, she would like to take the patient home with her after the surgery is done and when she is ready for discharge as they helped the granddaughters who are CNA and they would be living with her and can take care of her with instead of sending to rehab.   All the records are reviewed and case discussed with Care Management/Social Workerr. Management plans discussed with the patient, family and they are in agreement.  CODE STATUS: DNR  TOTAL TIME TAKING CARE OF THIS PATIENT: 35 minutes.     POSSIBLE D/C IN 1-2 DAYS, DEPENDING ON CLINICAL CONDITION.   Vaughan Basta M.D on 11/10/2018   Between 7am to 6pm - Pager - [phone]  After 6pm go to [email or web address] - password [email or web address] [facility]  Office  [phone]  CC: Primary care physician;  Patient, No Pcp Per  Note: This dictation was prepared with Dragon dictation along with smaller phrase technology. Any transcriptional errors that result from this process are unintentional.

## 2018-11-10 NOTE — Progress Notes (Signed)
PT Cancellation Note  Patient Details Name: Brittney Bean MRN: [id number] DOB: Nov 09, 1925   Cancelled Treatment:    Reason Eval/Treat Not Completed: Other (comment). 2nd attempt. Unable to arouse pt. Per RN, MD aware. Will hold and re-attempt next date.   [name] 11/10/2018, 1:26 PM  Greggory Stallion, PT, DPT [phone]

## 2018-11-10 NOTE — Progress Notes (Signed)
Attempted to fully arouse patient this morning. Patient vitals are stable, but patient very lethargic. MD paged and made aware that patient is unable to fully awaken and respond to nurse. Patient moaning and groaning but unable to answer questions.

## 2018-11-10 NOTE — Anesthesia Postprocedure Evaluation (Signed)
Anesthesia Post Note  Patient: Heath Badon  Procedure(s) Performed: INTRAMEDULLARY (IM) NAIL INTERTROCHANTRIC (Right )  Patient location during evaluation: Nursing Unit Anesthesia Type: Spinal Level of consciousness: awake and oriented Pain management: pain level controlled Vital Signs Assessment: post-procedure vital signs reviewed and stable Respiratory status: spontaneous breathing Cardiovascular status: stable Postop Assessment: no headache, no backache, patient able to bend at knees, adequate PO intake, no apparent nausea or vomiting and able to ambulate Anesthetic complications: no     Last Vitals:  Vitals:   11/10/18 0424 11/10/18 0506  BP: (!) 123/48 (!) 138/55  Pulse: 81 93  Resp:    Temp:    SpO2:  98%    Last Pain:  Vitals:   11/09/18 2343  TempSrc: Oral  PainSc:                  Lanora Manis

## 2018-11-10 NOTE — Progress Notes (Signed)
OT Cancellation Note  Patient Details Name: Brittney Bean MRN: [id number] DOB: 01-Sep-1925   Cancelled Treatment:    Reason Eval/Treat Not Completed: Fatigue/lethargy limiting ability to participate. Order received and chart reviewed. Upon arrival to pt room pt with Physical Therapist in room to attempt session. Per Physical therapy, pt lethargic and unable to remain awake to participate in therapy. Will follow acutely and re-attempt at a later time/date as available and pt medically appropriate for OT.   Shara Blazing, M.S., OTR/L Ascom: [phone] 11/10/18, 9:14 AM

## 2018-11-10 NOTE — Progress Notes (Signed)
PT Cancellation Note  Patient Details Name: Brittney Bean MRN: [id number] DOB: 1925/11/30   Cancelled Treatment:    Reason Eval/Treat Not Completed: Other (comment). Evaluation attempted, however pt very lethargic, only able to awaken for a few seconds prior to returning to sleep despite tactile, verbal cues. Unable to perform grip squeeze or participate in history, mumbles when asked orientation questions. Will re-attempt later today if patient able to participate.   Kodee Drury 11/10/2018, 9:10 AM  Greggory Stallion, PT, DPT [phone]

## 2018-11-11 LAB — BASIC METABOLIC PANEL
Anion gap: 4 — ABNORMAL LOW (ref 5–15)
BUN: 25 mg/dL — ABNORMAL HIGH (ref 8–23)
CO2: 23 mmol/L (ref 22–32)
Calcium: 7.7 mg/dL — ABNORMAL LOW (ref 8.9–10.3)
Chloride: 106 mmol/L (ref 98–111)
Creatinine, Ser: 0.74 mg/dL (ref 0.44–1.00)
GFR calc Af Amer: >60 mL/min (ref >60)
GFR calc non Af Amer: >60 mL/min (ref >60)
Glucose, Bld: 127 mg/dL — ABNORMAL HIGH (ref 70–99)
Potassium: 4 mmol/L (ref 3.5–5.1)
Sodium: 133 mmol/L — ABNORMAL LOW (ref 135–145)

## 2018-11-11 LAB — GLUCOSE, CAPILLARY
Glucose-Capillary: 114 mg/dL — ABNORMAL HIGH (ref 70–99)
Glucose-Capillary: 165 mg/dL — ABNORMAL HIGH (ref 70–99)
Glucose-Capillary: 134 mg/dL — ABNORMAL HIGH (ref 70–99)
Glucose-Capillary: 137 mg/dL — ABNORMAL HIGH (ref 70–99)

## 2018-11-11 LAB — CBC
HCT: 22.4 % — ABNORMAL LOW (ref 36.0–46.0)
HCT: 23.1 % — ABNORMAL LOW (ref 36.0–46.0)
Hemoglobin: 7.2 g/dL — ABNORMAL LOW (ref 12.0–15.0)
Hemoglobin: 7.5 g/dL — ABNORMAL LOW (ref 12.0–15.0)
MCH: 31.7 pg (ref 26.0–34.0)
MCH: 31.6 pg (ref 26.0–34.0)
MCHC: 32.1 g/dL (ref 30.0–36.0)
MCHC: 32.5 g/dL (ref 30.0–36.0)
MCV: 98.7 fL (ref 80.0–100.0)
MCV: 97.5 fL (ref 80.0–100.0)
Platelets: 136 10*3/uL — ABNORMAL LOW (ref 150–400)
Platelets: 135 10*3/uL — ABNORMAL LOW (ref 150–400)
RBC: 2.27 MIL/uL — ABNORMAL LOW (ref 3.87–5.11)
RBC: 2.37 MIL/uL — ABNORMAL LOW (ref 3.87–5.11)
RDW: 12.4 % (ref 11.5–15.5)
RDW: 12.4 % (ref 11.5–15.5)
WBC: 8.1 10*3/uL (ref 4.0–10.5)
WBC: 8.2 10*3/uL (ref 4.0–10.5)
nRBC: 0 % (ref 0.0–0.2)
nRBC: 0 % (ref 0.0–0.2)

## 2018-11-11 MED ORDER — POLYETHYLENE GLYCOL 3350 17 G PO PACK: 17.0000 g | PACK | Freq: Every day | ORAL | 0 refills | Status: AC

## 2018-11-11 MED ORDER — ENOXAPARIN SODIUM 40 MG/0.4ML ~~LOC~~ SOLN: 40.0000 mg | SUBCUTANEOUS | 0 refills | Status: AC

## 2018-11-11 MED ORDER — FERROUS SULFATE 325 (65 FE) MG PO TABS: 325.0000 mg | ORAL_TABLET | Freq: Three times a day (TID) | ORAL | 3 refills | Status: AC

## 2018-11-11 MED ORDER — ALUM & MAG HYDROXIDE-SIMETH 200-200-20 MG/5ML PO SUSP: 30.0000 mL | ORAL | 0 refills | Status: AC | PRN

## 2018-11-11 MED ORDER — DOCUSATE SODIUM 100 MG PO CAPS: 100.0000 mg | ORAL_CAPSULE | Freq: Two times a day (BID) | ORAL | 0 refills | Status: AC

## 2018-11-11 MED ORDER — BISACODYL 5 MG PO TBEC: 5.0000 mg | DELAYED_RELEASE_TABLET | Freq: Every day | ORAL | 0 refills | Status: AC | PRN

## 2018-11-11 MED ORDER — TRAMADOL HCL 50 MG PO TABS: 50.0000 mg | ORAL_TABLET | Freq: Four times a day (QID) | ORAL | 0 refills | Status: AC | PRN

## 2018-11-11 MED ORDER — POLYETHYLENE GLYCOL 3350 17 G PO PACK
17.0000 g | PACK | Freq: Every day | ORAL | Status: DC
  Administered 2018-11-11: 17 g via ORAL
  Filled 2018-11-11: qty 1

## 2018-11-11 MED ORDER — CALCIUM CARBONATE-VITAMIN D 500-200 MG-UNIT PO TABS: 1.0000 | ORAL_TABLET | Freq: Two times a day (BID) | ORAL | 9 refills | Status: AC

## 2018-11-11 MED ORDER — HYDROCODONE-ACETAMINOPHEN 5-325 MG PO TABS: 1.0000 | ORAL_TABLET | Freq: Four times a day (QID) | ORAL | 0 refills | Status: AC | PRN

## 2018-11-11 NOTE — Progress Notes (Signed)
Subjective:  POD #2 s/p intramedullary fixation for right intratrochanteric hip fracture.   Patient reports right hip pain as mild.  Patient is more alert today.  He is resting comfortably in bed.  Objective:   VITALS:   Vitals:   11/10/18 0830 11/10/18 1218 11/11/18 0003 11/11/18 0744  BP: 91/73 (!) 141/54 (!) 183/61 (!) 158/58  Pulse: 84 95 (!) 108 90  Resp: 16  16 18   Temp: 98.5 F (36.9 C) 98 F (36.7 C) 98.6 F (37 C) 98.3 F (36.8 C)  TempSrc: Axillary Oral    SpO2: 96% 94% 96% 98%  Weight:      Height:        PHYSICAL EXAM: Right lower extremity:  Neurovascular intact Sensation intact distally Intact pulses distally Dorsiflexion/Plantar flexion intact Incision: scant drainage No cellulitis present Compartment soft  LABS  Results for orders placed or performed during the hospital encounter of 11/08/18 (from the past 24 hour(s))  Glucose, capillary     Status: Abnormal   Collection Time: 11/10/18  5:01 PM  Result Value Ref Range   Glucose-Capillary 128 (H) 70 - 99 mg/dL   Comment 1 Notify RN    Comment 2 Document in Chart   Glucose, capillary     Status: Abnormal   Collection Time: 11/10/18  9:41 PM  Result Value Ref Range   Glucose-Capillary 161 (H) 70 - 99 mg/dL   Comment 1 Notify RN   CBC     Status: Abnormal   Collection Time: 11/11/18  4:53 AM  Result Value Ref Range   WBC 8.1 4.0 - 10.5 K/uL   RBC 2.27 (L) 3.87 - 5.11 MIL/uL   Hemoglobin 7.2 (L) 12.0 - 15.0 g/dL   HCT 16.122.4 (L) 09.636.0 - 04.546.0 %   MCV 98.7 80.0 - 100.0 fL   MCH 31.7 26.0 - 34.0 pg   MCHC 32.1 30.0 - 36.0 g/dL   RDW 40.912.4 81.111.5 - 91.415.5 %   Platelets 136 (L) 150 - 400 K/uL   nRBC 0.0 0.0 - 0.2 %  Basic metabolic panel     Status: Abnormal   Collection Time: 11/11/18  4:53 AM  Result Value Ref Range   Sodium 133 (L) 135 - 145 mmol/L   Potassium 4.0 3.5 - 5.1 mmol/L   Chloride 106 98 - 111 mmol/L   CO2 23 22 - 32 mmol/L   Glucose, Bld 127 (H) 70 - 99 mg/dL   BUN 25 (H) 8 - 23 mg/dL    Creatinine, Ser 7.820.74 0.44 - 1.00 mg/dL   Calcium 7.7 (L) 8.9 - 10.3 mg/dL   GFR calc non Af Amer >60 >60 mL/min   GFR calc Af Amer >60 >60 mL/min   Anion gap 4 (L) 5 - 15  Glucose, capillary     Status: Abnormal   Collection Time: 11/11/18  7:45 AM  Result Value Ref Range   Glucose-Capillary 114 (H) 70 - 99 mg/dL   Comment 1 Notify RN   Glucose, capillary     Status: Abnormal   Collection Time: 11/11/18 11:50 AM  Result Value Ref Range   Glucose-Capillary 165 (H) 70 - 99 mg/dL   Comment 1 Notify RN     Dg Hip Operative Unilat W Or W/o Pelvis Right  Result Date: 11/09/2018 CLINICAL DATA:  Intramedullary nail to the right femur EXAM: OPERATIVE right HIP (WITH PELVIS IF PERFORMED) 31 VIEWS TECHNIQUE: Fluoroscopic spot image(s) were submitted for interpretation post-operatively. COMPARISON:  None. FINDINGS: Thirty-one fluoroscopic  images were obtained during placement of an intramedullary nail into the right femur. Fluoroscopy time was reported as 279 seconds. IMPRESSION: Intraoperative fluoroscopy. Electronically Signed   By: Ulyses Jarred M.D.   On: 11/09/2018 17:27   Dg Femur Port, Min 2 Views Right  Result Date: 11/09/2018 CLINICAL DATA:  Status post right proximal femur fracture ORIF. EXAM: RIGHT FEMUR PORTABLE 2 VIEW COMPARISON:  11/08/2018. FINDINGS: The intertrochanteric fracture has been reduced with a compression screw and a long intramedullary rod. Primary fracture components are in near anatomic alignment. The orthopedic hardware is well-seated. No evidence of an acute fracture or operative complication. IMPRESSION: Well aligned right femur intertrochanteric fracture following ORIF. Electronically Signed   By: Lajean Manes M.D.   On: 11/09/2018 17:53    Assessment/Plan: 2 Days Post-Op   Active Problems:   Closed right hip fracture [facility])  Patient is doing well from an orthopedic standpoint.  Her vital signs are stable.  Her last blood pressure was 158/58.  Her hemoglobin is  7.2.  CBC will be rechecked in the a.m.  Continue physical therapy as patient can tolerate.  Continue Lovenox 40 mg daily for DVT prophylaxis is 4 weeks postop.  Patient will follow-up with me in the office in 10 to 14 days after discharge.  Patient is weightbearing as tolerated on the right lower extremity.    Thornton Park , MD 11/11/2018, 12:12 PM

## 2018-11-11 NOTE — Progress Notes (Signed)
[place] at [place] NAME: Brittney Bean    MR#:  [id number]  DATE OF BIRTH:  Jul 01, 1925  SUBJECTIVE:  CHIEF COMPLAINT:   Chief Complaint  Patient presents with  . Fall  . Hip Pain   Lives home alone, came after the fall and hip fracture.   Status post surgery 11/09/18 Patient was more awake today.  Noted to have some weakness in the right upper extremity by nurses.  REVIEW OF SYSTEMS:  CONSTITUTIONAL: No fever, fatigue or weakness.  EYES: No blurred or double vision.  EARS, NOSE, AND THROAT: No tinnitus or ear pain.  RESPIRATORY: No cough, shortness of breath, wheezing or hemoptysis.  CARDIOVASCULAR: No chest pain, orthopnea, edema.  GASTROINTESTINAL: No nausea, vomiting, diarrhea or abdominal pain.  GENITOURINARY: No dysuria, hematuria.  ENDOCRINE: No polyuria, nocturia,  HEMATOLOGY: No anemia, easy bruising or bleeding SKIN: No rash or lesion. MUSCULOSKELETAL: Right hip pain.Marland Kitchen   NEUROLOGIC: No tingling, numbness, weakness.  PSYCHIATRY: No anxiety or depression.   ROS  DRUG ALLERGIES:  No Known Allergies  VITALS:  Blood pressure (!) 163/68, pulse (!) 101, temperature 98.6 F (37 C), resp. rate 18, height 5\' 4"  (1.626 m), weight 103 kg, SpO2 100 %.  PHYSICAL EXAMINATION:  GENERAL:  85 y.o.-year-old patient lying in the bed with no acute distress.  EYES: Pupils equal, round, reactive to light and accommodation. No scleral icterus. Extraocular muscles intact.  HEENT: Head atraumatic, normocephalic. Oropharynx and nasopharynx clear.  NECK:  Supple, no jugular venous distention. No thyroid enlargement, no tenderness.  LUNGS: Normal breath sounds bilaterally, no wheezing, rales,rhonchi or crepitation. No use of accessory muscles of respiration.  CARDIOVASCULAR: S1, S2 normal. No murmurs, rubs, or gallops.  ABDOMEN: Soft, nontender, nondistended. Bowel sounds present. No organomegaly or mass.  EXTREMITIES: No pedal edema, cyanosis, or  clubbing.  NEUROLOGIC: Cranial nerves II through XII are intact. Muscle strength 3-4/5 in all extremities except for the right lower extremity where she has pain on movement. Sensation intact. Gait not checked.  PSYCHIATRIC: The patient is drowsy but arousable.  SKIN: No obvious rash, lesion, or ulcer.   Physical Exam LABORATORY PANEL:   CBC Recent Labs  Lab 11/11/18 1257  WBC 8.2  HGB 7.5*  HCT 23.1*  PLT 135*   ------------------------------------------------------------------------------------------------------------------  Chemistries  Recent Labs  Lab 11/08/18 1556  11/11/18 0453  NA 139   < > 133*  K 4.4   < > 4.0  CL 109   < > 106  CO2 22   < > 23  GLUCOSE 212*   < > 127*  BUN 19   < > 25*  CREATININE 0.92   < > 0.74  CALCIUM 8.7*   < > 7.7*  AST 22  --   --   ALT 10  --   --   ALKPHOS 95  --   --   BILITOT 0.8  --   --    < > = values in this interval not displayed.   ------------------------------------------------------------------------------------------------------------------  Cardiac Enzymes No results for input(s): TROPONINI in the last 168 hours. ------------------------------------------------------------------------------------------------------------------  RADIOLOGY:  Dg Femur Port, Min 2 Views Right  Result Date: 11/09/2018 CLINICAL DATA:  Status post right proximal femur fracture ORIF. EXAM: RIGHT FEMUR PORTABLE 2 VIEW COMPARISON:  11/08/2018. FINDINGS: The intertrochanteric fracture has been reduced with a compression screw and a long intramedullary rod. Primary fracture components are in near anatomic alignment. The orthopedic hardware is well-seated. No  evidence of an acute fracture or operative complication. IMPRESSION: Well aligned right femur intertrochanteric fracture following ORIF. Electronically Signed   By: Amie Portlandavid  Ormond M.D.   On: 11/09/2018 17:53    ASSESSMENT AND PLAN:   Active Problems:   Closed right hip fracture  [facility])  85 year old female with no significant past medical history and has no PCP comes in because of the fall and suffered right hip fracture.   #1. right intertrochanteric comminuted fracture initial encounter.   continue pain management, DVT prophylaxis, consult orthopedics, Dr. Martha ClanKrasinski  Status post surgery 11/09/18  #2  Acute blood loss anemia Monitor hemoglobin, currently no transfusion needed.  We will give iron supplements orally. Hemoglobin is stable.  #4 malignant hypertension, no history of high blood pressure before,  Hydralazine IV currently, will continue to monitor after stopping the IV fluid if she need to be on oral antihypertensive medications then we will start before discharge.  # 5 right UL weakness and dementia I spoke to patient's daughter on the phone and as per her these are patient's chronic problems and she is not surprised by that.  I spoke to patient's daughter on phone, she would like to take the patient home with her after the surgery is done and when she is ready for discharge as they helped the granddaughters who are CNA and they would be living with her and can take care of her with instead of sending to rehab. We still need to make some more arrangements about the lift and wheelchair at home so would like to discharge her tomorrow home.   All the records are reviewed and case discussed with Care Management/Social Workerr. Management plans discussed with the patient, family and they are in agreement.  CODE STATUS: DNR  TOTAL TIME TAKING CARE OF THIS PATIENT: 35 minutes.     POSSIBLE D/C IN 1-2 DAYS, DEPENDING ON CLINICAL CONDITION.   Altamese DillingVaibhavkumar Ailea Rhatigan M.D on 11/11/2018   Between 7am to 6pm - Pager - [phone]  After 6pm go to [email or web address] - password [email or web address]  [facility] [place] [facility] [place] [facility]  Office  [phone]  CC: Primary care physician; Patient, No Pcp Per  Note: This dictation was prepared with Dragon dictation along with  smaller phrase technology. Any transcriptional errors that result from this process are unintentional.

## 2018-11-11 NOTE — Evaluation (Signed)
Physical Therapy Evaluation Patient Details Name: Brittney Bean Perrelli MRN: [id number] DOB: 09-26-1925 Today's Date: 11/11/2018   History of Present Illness  presented to ER secondary to fall in home environment, acute onset of R hip pain; admitted for management of R intertrochanteric hip fracture, s/p IM nailing 11/09/18, WBAT.  Clinical Impression  Upon awake upon entry to room, attempting to feed self breakfast.  Significant difficulty with management of utensils (R UE) as well as oral management of food bolus.  Assisted for remaining breakfast desired; improved control and management of softer foods.  Discussed benefit of SLP consult with primary RN. Patient agreeable to session, but generally lethargic, intermittently closing eyes and returning to sleep if not consistently stimulated (verbal, light touch).  Generally painful to R LE (FACES 6/10), requiring act assist for movement throughout all planes.  Currently requiring max/total assist +2 for all functional activities (bed mobility, sit/stand); unsafe/unable to attempt gait efforts at this time. Of note, patient did report sense of R UE numbness during session, + drift noted with strength/ROM testing; noted mild eye droop and facial droop. RN informed/aware and to discuss with attending; may benefit from head CT to rule out neurological etiology. Would benefit from skilled PT to address above deficits and promote optimal return to PLOF; recommend transition to STR upon discharge from acute hospitalization.  Per chart, appears patient and family prefer discharge home; if transition home planned, recommend total care equipment set up (detailed below).    Follow Up Recommendations SNF    Equipment Recommendations  Wheelchair (measurements PT);Wheelchair cushion (measurements PT);Hospital bed;Rolling walker with 5" wheels;3in1 (PT)(hoyer lift)    Recommendations for Other Services       Precautions / Restrictions Precautions Precautions:  Fall Restrictions Weight Bearing Restrictions: Yes RLE Weight Bearing: Weight bearing as tolerated      Mobility  Bed Mobility Overal bed mobility: Needs Assistance Bed Mobility: Supine to Sit;Sit to Supine     Supine to sit: Max assist;Total assist;+2 for physical assistance Sit to supine: Max assist;Total assist;+2 for physical assistance   General bed mobility comments: total assist for management of R LE, mod/max assist for truncal elevation; min/mod assist for unsupported sitting balance (posterior lean) once upright  Transfers Overall transfer level: Needs assistance Equipment used: Rolling walker (2 wheeled) Transfers: Sit to/from Stand Sit to Stand: Max assist;+2 physical assistance         General transfer comment: step by step cuing for attention to and initiation of task  Ambulation/Gait             General Gait Details: unsafe/unable  Stairs            Wheelchair Mobility    Modified Rankin (Stroke Patients Only)       Balance Overall balance assessment: Needs assistance Sitting-balance support: No upper extremity supported;Feet supported Sitting balance-Leahy Scale: Fair Sitting balance - Comments: fair midline in M/L plane, does list posteriorally with fatigue or divided attention (very limited, delayed self-righting) Postural control: Posterior lean Standing balance support: Bilateral upper extremity supported Standing balance-Leahy Scale: Zero                               Pertinent Vitals/Pain Pain Assessment: Faces Faces Pain Scale: Hurts even more Pain Location: R hip with movement Pain Descriptors / Indicators: Aching;Guarding;Grimacing;Moaning Pain Intervention(s): Limited activity within patient's tolerance;Monitored during session;Premedicated before session;Repositioned    Home Living Family/patient expects to be discharged to:: Private  residence Living Arrangements: (lives with daughter) Available Help at  Discharge: Family;Available PRN/intermittently Type of Home: House Home Access: (has 1-2 steps; per granddaughter, ramp to be in place at discharge)     Home Layout: One level Home Equipment: Walker - 2 wheels;Bedside commode      Prior Function Level of Independence: Independent with assistive device(s)         Comments: Per granddaughter (phone), patient mod indep with RW, assist from family for ADLs and household chores as needed.  Patient 'very independent and stubborn'.     Hand Dominance   Dominant Hand: Right    Extremity/Trunk Assessment   Upper Extremity Assessment Upper Extremity Assessment: (4-/5 strength R UE with reported numbness in hand, + drift with elevation; L UE grossly WFL)    Lower Extremity Assessment Lower Extremity Assessment: (R LE grossly 3-/5 throughotu, generally guarded due to pain; L LE 4-/5)       Communication   Communication: HOH  Cognition Arousal/Alertness: Lethargic Behavior During Therapy: Flat affect Overall Cognitive Status: No family/caregiver present to determine baseline cognitive functioning                                 General Comments: generally sleepy, but arousable to voice, light touch from therapist; does drift to sleep if not constantly stimulated      General Comments      Exercises Other Exercises Other Exercises: Supine R LE therex, 1x8, act assit: ankle pumps, heel slides, hip abduct/adduct.  Tolerating approx 40% normal ROM, limited by pain. Other Exercises: Patient attempting to eat breakfast upon therapist arrival to room. Noted to have bit of pancake positioned in mouth, but limited awareness of the fact that if was there or what to do with it orally. Therapist re-attempted another bit; similar response. Extended time for oral management, generally discoordinated and unsure.  Re-attempted with applesauce; imrpoved ability to manage.  discussed with primary RN-suggested SLP consult for possible  dietary modifications. Other Exercises: During session, patient did report sense of R hand numbness; mild eye/facial drop noted, along with + R UE drift.  RN informed/aware and to discuss with attending physician. May benefit from head CT to rule out additional etiology.   Assessment/Plan    PT Assessment Patient needs continued PT services  PT Problem List Decreased strength;Decreased range of motion;Decreased cognition;Decreased balance;Decreased activity tolerance;Decreased mobility;Decreased coordination;Decreased knowledge of use of DME;Decreased safety awareness;Decreased knowledge of precautions;Cardiopulmonary status limiting activity;Pain;Obesity;Impaired sensation       PT Treatment Interventions DME instruction;Gait training;Functional mobility training;Therapeutic activities;Therapeutic exercise;Balance training;Neuromuscular re-education;Cognitive remediation;Patient/family education    PT Goals (Current goals can be found in the Care Plan section)  Acute Rehab PT Goals Patient Stated Goal: agreeable to session PT Goal Formulation: With patient Time For Goal Achievement: 11/25/18 Potential to Achieve Goals: Fair    Frequency 7X/week   Barriers to discharge Decreased caregiver support      Co-evaluation               AM-PAC PT "6 Clicks" Mobility  Outcome Measure Help needed turning from your back to your side while in a flat bed without using bedrails?: A Lot Help needed moving from lying on your back to sitting on the side of a flat bed without using bedrails?: Total Help needed moving to and from a bed to a chair (including a wheelchair)?: Total Help needed standing up from a chair using your  arms (e.g., wheelchair or bedside chair)?: Total Help needed to walk in hospital room?: Total Help needed climbing 3-5 steps with a railing? : Total 6 Click Score: 7    End of Session Equipment Utilized During Treatment: Gait belt Activity Tolerance: Patient limited by  fatigue Patient left: in bed;with call bell/phone within reach;with bed alarm set;with nursing/sitter in room Nurse Communication: Mobility status PT Visit Diagnosis: Difficulty in walking, not elsewhere classified (R26.2);Muscle weakness (generalized) (M62.81);Pain Pain - Right/Left: Right Pain - part of body: Hip    Time: 5681-2751 PT Time Calculation (min) (ACUTE ONLY): 43 min   Charges:   PT Evaluation $PT Eval Moderate Complexity: 1 Mod PT Treatments $Therapeutic Exercise: 8-22 mins $Therapeutic Activity: 8-22 mins        Mahki Spikes H. Owens Shark, PT, DPT, NCS 11/11/18, 11:17 AM [phone]

## 2018-11-11 NOTE — TOC Progression Note (Signed)
Transition of Care [facility]) - Progression Note    Patient Details  Name: Brittney Bean MRN: [id number] Date of Birth: 08/22/1925  Transition of [facility]) CM/SW Contact  Su Hilt, RN Phone Number: 11/11/2018, 4:28 PM  Clinical Narrative:      Talked to the daughter [name] and explained that the patient is not able to do anything including feeding on her own.  She stated that there are multiple family members in the healthcare field that will be there at all times including 2 nurses and 3 CNAs.  She would benefit from a hoyer lift and I notified Brad with [employer].  She has a BSC at home and a wheel chair the Hospital bed was delivered yesterday.  The patient is anticipated to DC home tomorrow after the lift is delivered and [name] stated understanding  Expected Discharge Plan: [place] Barriers to Discharge: Continued Medical Work up  Expected Discharge Plan and Services Expected Discharge Plan: [place]   Discharge Planning Services: CM Consult Post Acute Care Choice: [place] arrangements for the past 2 months: Single Family Home Expected Discharge Date: 11/11/18               DME Arranged: Hospital bed DME Agency: [employer] Date DME Agency Contacted: 11/09/18 Time DME Agency Contacted: [id number] Representative spoke with at DME Agency: [place]: PT, Nurse's Aide, RN [place] Agency: [facility] Date [place]: 11/09/18 Time [place]: [id number] Representative spoke with at [place]: [place] ([place]) Interventions    Readmission Risk Interventions No flowsheet data found.

## 2018-11-11 NOTE — Plan of Care (Signed)

## 2018-11-11 NOTE — Progress Notes (Signed)
Speech Therapy note: received order; reviewed chart notes. Until able to see pt for full BSE assessment, diet modified to a Dysphagia level 2 (MINCED foods) consistency d/t reports of pt having difficulty managing solid textured foods orally. Pt also appears to be presenting w/ Cognitive deficits; min confusion w/ ADLs and requires max assist at times. Pt is POD#1 from hip surgery at [place]. Unsure of pt's baseline Cognitive functioning as there are no prior notes of recent admissions; no Head imaging. She lives at home alone.  ST services will f/u tomorrow w/ BSE; further assessment.     Orinda Kenner, [place], CCC-SLP

## 2018-11-11 NOTE — Care Management Important Message (Signed)
Important Message  Patient Details  Name: Brittney Bean MRN: [id number] Date of Birth: 02-03-1926   Medicare Important Message Given:  Yes     Juliann Pulse A Corrin Sieling 11/11/2018, 11:26 AM

## 2018-11-11 NOTE — Evaluation (Signed)
Occupational Therapy Evaluation Patient Details Name: Brittney Bean MRN: [id number] DOB: 1925/07/14 Today's Date: 11/11/2018    History of Present Illness presented to ER secondary to fall in home environment, acute onset of R hip pain; admitted for management of R intertrochanteric hip fracture, s/p IM nailing 11/09/18, WBAT.   Clinical Impression   Pt seen for OT evaluation this date, POD#1 from above surgery. Per chart, granddaughter reports pt is "independent and stubborn" at baseline. Pt sleeping upon attempt, wakes to therapist's voice. Able to answer simple questions well, but requiring additional time and cues to answer/attempt to answer more confrontational questions. Pt +2 for mobility efforts. Requires max assist for self feeding with difficulty coordinating and planning motor movements to scoop spoon, undershooting and bringing an empty spoon to her mouth and states, "that tastes good," despite nothing on spoon. Pt had difficulty managing food and drink boluses in mouth as well. RN and MD notified. Pt may benefit from assist for all meals. Mild strength deficits R weaker than L. Per chart, family wants to take pt home. If this is the case, pt will require total care set up (see PT note for details). Pt would benefit from additional instruction in self care skills and techniques to help maintain precautions with or without assistive devices to support recall and carryover prior to discharge.    Follow Up Recommendations  SNF    Equipment Recommendations  3 in 1 bedside commode    Recommendations for Other Services       Precautions / Restrictions Precautions Precautions: Fall Restrictions Weight Bearing Restrictions: Yes RLE Weight Bearing: Weight bearing as tolerated      Mobility Bed Mobility Overal bed mobility: Needs Assistance             General bed mobility comments: total assist +2  Transfers                      Balance Overall balance assessment:  Needs assistance     Sitting balance - Comments: leaning slightly to the L, improved with pillow propped under LUE                                   ADL either performed or assessed with clinical judgement   ADL Overall ADL's : Needs assistance/impaired Eating/Feeding: Maximal assistance;Bed level Eating/Feeding Details (indicate cue type and reason): undershooting plate with spoon but brings to mouth as if she has food on the spoon, doesn't appear to notice as she attempts to chew and then states, "That's pretty good!" after taking a spoonful of nothing. Grooming: Bed level;Minimal assistance   Upper Body Bathing: Bed level;Maximal assistance;Moderate assistance   Lower Body Bathing: Bed level;Maximal assistance   Upper Body Dressing : Bed level;Maximal assistance   Lower Body Dressing: Bed level;Maximal assistance                       Vision Patient Visual Report: No change from baseline Vision Assessment?: Vision impaired- to be further tested in functional context Additional Comments: cognition limiting formal visual assessment, pt does significantly undershoot when attempting to scoop food onto her spoon     Perception     Praxis      Pertinent Vitals/Pain Pain Assessment: Faces Faces Pain Scale: No hurt Pain Intervention(s): Limited activity within patient's tolerance;Monitored during session     Hand Dominance Right   Extremity/Trunk  Assessment Upper Extremity Assessment Upper Extremity Assessment: (RUE grossly 4/5, LUE grossly 4+/5, denies sensory deficits)   Lower Extremity Assessment Lower Extremity Assessment: Defer to PT evaluation(R LE grossly 3-/5 throughotu, generally guarded due to pain; L LE 4-/5)       Communication Communication Communication: HOH   Cognition Arousal/Alertness: Lethargic Behavior During Therapy: Flat affect Overall Cognitive Status: No family/caregiver present to determine baseline cognitive  functioning                                 General Comments: generally sleepy, but arousable to voice, light touch from therapist; stares off and begins to close eyes if not provided with additional verbal cues, responds promptly to most simple questions, requires additional time/cues to respond to more abstract or complex questions   General Comments       Exercises Other Exercises Other Exercises: tray set up and problem solving to improve access, cues for initiating and reaching her food, ultimately requiring set up of loaded spoon into her hand at which point she brought to mouth   Shoulder Instructions      Home Living Family/patient expects to be discharged to:: Private residence Living Arrangements: (lives with daughter) Available Help at Discharge: Family;Available PRN/intermittently Type of Home: House Home Access: (has 1-2 steps; per granddaughter, ramp to be in place at discharge)     Home Layout: One level               Home Equipment: Walker - 2 wheels;Bedside commode          Prior Functioning/Environment Level of Independence: Independent with assistive device(s)        Comments: Per granddaughter (phone), patient mod indep with RW, assist from family for ADLs and household chores as needed.  Patient 'very independent and stubborn'.        OT Problem List: Decreased strength;Decreased coordination;Decreased range of motion;Decreased cognition;Decreased activity tolerance;Impaired balance (sitting and/or standing);Impaired vision/perception;Impaired UE functional use      OT Treatment/Interventions: Self-care/ADL training;Therapeutic exercise;Therapeutic activities;Neuromuscular education;Cognitive remediation/compensation;DME and/or AE instruction;Patient/family education;Balance training    OT Goals(Current goals can be found in the care plan section) Acute Rehab OT Goals Patient Stated Goal: agreeable to session OT Goal Formulation:  With patient Time For Goal Achievement: 11/25/18 Potential to Achieve Goals: Good ADL Goals Pt Will Perform Grooming: with min guard assist;sitting Pt Will Transfer to Toilet: squat pivot transfer;bedside commode;with mod assist;with +2 assist Additional ADL Goal #1: Pt will sit EOB with CGA to eat meal with Min verbal cues to initiate and use utensil properly to bring food to her mouth.  OT Frequency: Min 2X/week   Barriers to D/C:            Co-evaluation              AM-PAC OT "6 Clicks" Daily Activity     Outcome Measure Help from another person eating meals?: A Lot Help from another person taking care of personal grooming?: A Lot Help from another person toileting, which includes using toliet, bedpan, or urinal?: Total Help from another person bathing (including washing, rinsing, drying)?: Total Help from another person to put on and taking off regular upper body clothing?: A Lot Help from another person to put on and taking off regular lower body clothing?: Total 6 Click Score: 9   End of Session Nurse Communication: Other (comment)(cognitive and possible neuro deficits noted - RN and  MD notified)  Activity Tolerance: Patient limited by fatigue Patient left: in bed;with call bell/phone within reach;with bed alarm set  OT Visit Diagnosis: Other abnormalities of gait and mobility (R26.89);Muscle weakness (generalized) (M62.81);Other symptoms and signs involving cognitive function                Time: 1347-1403 OT Time Calculation (min): 16 min Charges:  OT General Charges $OT Visit: 1 Visit OT Evaluation $OT Eval Moderate Complexity: 1 Mod  Richrd PrimeJamie Stiller, MPH, MS, OTR/L ascom [phone] 11/11/18, 3:20 PM

## 2018-11-11 NOTE — Progress Notes (Cosign Needed)
Patient suffers from right hip fracture which impairs their ability to perform daily activities like ADLs in the home.  A walking aidwill not resolve  issue with performing activities of daily living. A wheelchair will allow patient to safely perform daily activities. Patient is not able to propel themselves in the home using a standard weight wheelchair due to weakness:. Patient can self propel in the lightweight wheelchair. Length of need lifetime 99 months. Accessories: elevating leg rests back cushion, wheel locks, extensions and anti-tippers.

## 2018-11-12 LAB — CBC
HCT: 24.6 % — ABNORMAL LOW (ref 36.0–46.0)
Hemoglobin: 8.1 g/dL — ABNORMAL LOW (ref 12.0–15.0)
MCH: 31.9 pg (ref 26.0–34.0)
MCHC: 32.9 g/dL (ref 30.0–36.0)
MCV: 96.9 fL (ref 80.0–100.0)
Platelets: 178 10*3/uL (ref 150–400)
RBC: 2.54 MIL/uL — ABNORMAL LOW (ref 3.87–5.11)
RDW: 12.4 % (ref 11.5–15.5)
WBC: 10.6 10*3/uL — ABNORMAL HIGH (ref 4.0–10.5)
nRBC: 0 % (ref 0.0–0.2)

## 2018-11-12 LAB — GLUCOSE, CAPILLARY: Glucose-Capillary: 126 mg/dL — ABNORMAL HIGH (ref 70–99)

## 2018-11-12 NOTE — Progress Notes (Signed)
Patient is being discharged home this afternoon via EMS. Spoke to Addis, patient's daughter, about discharge instructions and RX. IV removed and patient getting ready for transport. EMS called.

## 2018-11-12 NOTE — Evaluation (Signed)
Clinical/Bedside Swallow Evaluation Patient Details  Name: Brittney Bean MRN: [id number] Date of Birth: 1925-06-07  Today's Date: 11/12/2018 Time: SLP Start Time (ACUTE ONLY): 1030 SLP Stop Time (ACUTE ONLY): 1109 SLP Time Calculation (min) (ACUTE ONLY): 39 min  Past Medical History: History reviewed. No pertinent past medical history. Past Surgical History:  Past Surgical History:  Procedure Laterality Date  . INTRAMEDULLARY (IM) NAIL INTERTROCHANTERIC Right 11/09/2018   Procedure: INTRAMEDULLARY (IM) NAIL INTERTROCHANTRIC;  Surgeon: Thornton Park, MD;  Location: [facility] ORS;  Service: Orthopedics;  Laterality: Right;   HPI:  H&P 11/08/2018: "Brittney Bean  is a 85 y.o. female with no past medical history lives alone had a fall at home.  Patient states that she stood up from a chair and turned and fell on right hip, t x-ray of right hip showed a right hip fracture admitting for the same.  Patient is very upset she lost her daughter a week ago and very tearful.  Patient blood pressure was very elevated when she came.  Says that she has no PCP and said ''Dr. Marcene Brawn " is her doctor.  Patient says that she uses wheelchair.  Has any recent fever or cough."   Speech Therapy note 11/11/2018: "received order; reviewed chart notes. Until able to see pt for full BSE assessment, diet modified to a Dysphagia level 2 (MINCED foods) consistency d/t reports of pt having difficulty managing solid textured foods orally. Pt also appears to be presenting w/ Cognitive deficits; min confusion w/ ADLs and requires max assist at times. Pt is POD#1 from hip surgery at [place]. Unsure of pt's baseline Cognitive functioning as there are no prior notes of recent admissions; no Head imaging. She lives at home alone.    Assessment / Plan / Recommendation Clinical Impression  The patient is not presenting with significant clinical indicators of aspiration.  She is able to pull thin liquid via large bore straw with no cough, vocal quality  change, or any other S/S aspiration. She is able to take pureed bolus from spoon and swallow with no significant oral residue or clinical indicators of aspiration.  She is able to masticate and swallow yogurt with crumbled graham cracker. The patient has had poor intake per chart.  She would not continue with trial boluses stating, "I don't want any more to eat". The patient appears to be unwilling to eat vs unable to eat.  Recommend continuing the dysphagia 2/minced diet with thin liquids.  I would anticipate return to swallowing/oral intake baseline when she is feeling better and at home with family.  SLP Visit Diagnosis: Dysphagia, unspecified (R13.10)    Aspiration Risk  Risk for inadequate nutrition/hydration    Diet Recommendation Dysphagia 2 (Fine chop);Thin liquid   Liquid Administration via: Straw Medication Administration: Whole meds with puree Supervision: Staff to assist with self feeding Compensations: Minimize environmental distractions;Slow rate;Small sips/bites;Other (Comment)(Encourage intake) Postural Changes: Seated upright at 90 degrees;Remain upright for at least 30 minutes after po intake    Other  Recommendations Oral Care Recommendations: Oral care BID;Staff/trained caregiver to provide oral care   Follow up Recommendations None      Frequency and Duration            Prognosis Prognosis for Safe Diet Advancement: Fair Barriers to Reach Goals: Cognitive deficits      Swallow Study   General Date of Onset: 11/08/18 HPI: H&P 11/08/2018: "Brittney Bean  is a 85 y.o. female with no past medical history lives alone had a fall at  home.  Patient states that she stood up from a chair and turned and fell on right hip, t x-ray of right hip showed a right hip fracture admitting for the same.  Patient is very upset she lost her daughter a week ago and very tearful.  Patient blood pressure was very elevated when she came.  Says that she has no PCP and said ''Dr. Nickolas MadridJesus " is her  doctor.  Patient says that she uses wheelchair.  Has any recent fever or cough." Speech Therapy note 11/11/2018: "received order; reviewed chart notes. Until able to see pt for full BSE assessment, diet modified to a Dysphagia level 2 (MINCED foods) consistency d/t reports of pt having difficulty managing solid textured foods orally. Pt also appears to be presenting w/ Cognitive deficits; min confusion w/ ADLs and requires max assist at times. Pt is POD#1 from hip surgery at 89y. Unsure of pt's baseline Cognitive functioning as there are no prior notes of recent admissions; no Head imaging. She lives at home alone.  Type of Study: Bedside Swallow Evaluation Previous Swallow Assessment: None known Diet Prior to this Study: Dysphagia 2 (chopped);Thin liquids;Other (Comment)(Home diet unknown, presumably regaular) Temperature Spikes Noted: No Respiratory Status: Room air History of Recent Intubation: No Behavior/Cognition: Alert;Cooperative;Pleasant mood;Requires cueing Oral Cavity Assessment: Within Functional Limits Oral Cavity - Dentition: Dentures, top;Dentures, bottom Self-Feeding Abilities: Needs assist Patient Positioning: Upright in bed Baseline Vocal Quality: Normal Volitional Cough: Cognitively unable to elicit Volitional Swallow: Unable to elicit    Oral/Motor/Sensory Function Overall Oral Motor/Sensory Function: Within functional limits   Ice Chips     Thin Liquid Thin Liquid: Within functional limits Presentation: Straw;Self Fed    Nectar Thick     Honey Thick     Puree Puree: Within functional limits Presentation: Spoon   Solid     Solid: Within functional limits(MINCED) Presentation: Arlyss QueenSpoon     Dellamae Rosamilia G Cola Gane, MS/CCC- SLP  Tedd SiasAbernathy, Lynnell DikeSusie 11/12/2018,11:09 AM

## 2018-11-12 NOTE — Progress Notes (Signed)
  Subjective:  POD #3 s/p IM fixation of right intertrochanteric hip fracture.   Patient reports right hip pain as mild.    Objective:   VITALS:   Vitals:   11/12/18 0005 11/12/18 0203 11/12/18 0421 11/12/18 0837  BP: (!) 188/82 (!) 171/58 (!) 166/55 (!) 158/50  Pulse: (!) 106 (!) 103 (!) 108 (!) 105  Resp: 18     Temp: 98.7 F (37.1 C) 98.7 F (37.1 C) 98.4 F (36.9 C) 98.2 F (36.8 C)  TempSrc: Oral Oral Oral Oral  SpO2: 100% 98% 96% 99%  Weight:      Height:        PHYSICAL EXAM: Right lower extremity Neurovascular intact Sensation intact distally Intact pulses distally Dorsiflexion/Plantar flexion intact Incision: moderate drainage No cellulitis present Compartment soft  LABS  Results for orders placed or performed during the hospital encounter of 11/08/18 (from the past 24 hour(s))  Glucose, capillary     Status: Abnormal   Collection Time: 11/11/18  4:53 PM  Result Value Ref Range   Glucose-Capillary 134 (H) 70 - 99 mg/dL   Comment 1 Notify RN   Glucose, capillary     Status: Abnormal   Collection Time: 11/11/18  9:26 PM  Result Value Ref Range   Glucose-Capillary 137 (H) 70 - 99 mg/dL   Comment 1 Notify RN   CBC     Status: Abnormal   Collection Time: 11/12/18  4:55 AM  Result Value Ref Range   WBC 10.6 (H) 4.0 - 10.5 K/uL   RBC 2.54 (L) 3.87 - 5.11 MIL/uL   Hemoglobin 8.1 (L) 12.0 - 15.0 g/dL   HCT 24.6 (L) 36.0 - 46.0 %   MCV 96.9 80.0 - 100.0 fL   MCH 31.9 26.0 - 34.0 pg   MCHC 32.9 30.0 - 36.0 g/dL   RDW 12.4 11.5 - 15.5 %   Platelets 178 150 - 400 K/uL   nRBC 0.0 0.0 - 0.2 %  Glucose, capillary     Status: Abnormal   Collection Time: 11/12/18  7:48 AM  Result Value Ref Range   Glucose-Capillary 126 (H) 70 - 99 mg/dL   Comment 1 Notify RN     No results found.  Assessment/Plan: 3 Days Post-Op   Active Problems:   Closed right hip fracture ([place])  I have spoken with the patient's nurse.  She will change the proximal dressing which is  saturated with serous drainage.  Patient will be discharged home today.  She should remain on Lovenox 40 mg daily x4 weeks postop for DVT prophylaxis.  She is weightbearing as tolerated on the right lower extremity.  She will follow-up with me in approximately 10 days upon discharge from the hospital in the office.  Patient is being discharged home with home services per the family's request.   Thornton Park , MD 11/12/2018, 1:45 PM

## 2018-11-12 NOTE — Discharge Summary (Signed)
[facility] - [place] at [facility]   PATIENT NAME: Brittney Bean    MR#:  [id number]  DATE OF BIRTH:  01-Jun-1925  DATE OF ADMISSION:  11/08/2018 ADMITTING PHYSICIAN: Katha Hamming, MD  DATE OF DISCHARGE: 11/12/2018   PRIMARY CARE PHYSICIAN: Patient, No Pcp Per    ADMISSION DIAGNOSIS:  Fall, initial encounter [W19.XXXA] Closed right hip fracture, initial encounter (HCC) [S72.001A]  DISCHARGE DIAGNOSIS:  Active Problems:   Closed right hip fracture (HCC)   SECONDARY DIAGNOSIS:  History reviewed. No pertinent past medical history.  HOSPITAL COURSE:   85 year old female with no significant past medical history and has no PCP comes in because of the fall and suffered right hip fracture.   #1.right intertrochanteric comminuted fracture initial encounter.   continue pain management, DVT prophylaxis, consult orthopedics, Dr. Martha Clan  Status post surgery 11/09/18  #2  Acute blood loss anemia Monitor hemoglobin, currently no transfusion needed.  We will give iron supplements orally. Hemoglobin is stable.  #4 malignant hypertension, no history of high blood pressure before,  Hydralazine IV currently, will continue to monitor after stopping the IV fluid if she need to be on oral antihypertensive medications then we will start before discharge.  # 5 right UL weakness and dementia I spoke to patient's daughter on the phone and as per her these are patient's chronic problems and she is not surprised by that.  I spoke to patient's daughter on phone, she would like to take the patient home with her after the surgery is done and when she is ready for discharge as they helped the granddaughters who are CNA and they would be living with her and can take care of her with instead of sending to rehab.  DISCHARGE CONDITIONS:   Stable.  CONSULTS OBTAINED:  Treatment Team:  Juanell Fairly, MD  DRUG ALLERGIES:  No Known Allergies  DISCHARGE  MEDICATIONS:   Allergies as of 11/12/2018   No Known Allergies     Medication List    TAKE these medications   alum & mag hydroxide-simeth 200-200-20 MG/5ML suspension Commonly known as: MAALOX/MYLANTA Take 30 mLs by mouth every 4 (four) hours as needed for indigestion.   bisacodyl 5 MG EC tablet Commonly known as: DULCOLAX Take 1 tablet (5 mg total) by mouth daily as needed for moderate constipation.   calcium-vitamin D 500-200 MG-UNIT tablet Commonly known as: OSCAL WITH D Take 1 tablet by mouth 2 (two) times daily.   docusate sodium 100 MG capsule Commonly known as: COLACE Take 1 capsule (100 mg total) by mouth 2 (two) times daily.   enoxaparin 40 MG/0.4ML injection Commonly known as: LOVENOX Inject 0.4 mLs (40 mg total) into the skin daily for 20 days.   ferrous sulfate 325 (65 FE) MG tablet Take 1 tablet (325 mg total) by mouth 3 (three) times daily after meals.   HYDROcodone-acetaminophen 5-325 MG tablet Commonly known as: NORCO/VICODIN Take 1 tablet by mouth every 6 (six) hours as needed for severe pain (pain score 4-6).   polyethylene glycol 17 g packet Commonly known as: MIRALAX / GLYCOLAX Take 17 g by mouth daily.   traMADol 50 MG tablet Commonly known as: ULTRAM Take 1 tablet (50 mg total) by mouth every 6 (six) hours as needed for moderate pain.            Durable Medical Equipment  (From admission, onward)         Start     Ordered   11/11/18 1623  For home use only DME Other see comment  Once    Comments: Patient is a total care patient not able to get out of bed and requires a Hoyer lift to be able to allow for toileting and bathing and other care needs  Question:  Length of Need  Answer:  Lifetime   11/11/18 1623   11/11/18 1619  For home use only DME lightweight manual wheelchair with seat cushion  Once    Comments: Patient suffers from right hip fracture which impairs their ability to perform daily activities like ADLs in the home.  A  walking aidwill not resolve  issue with performing activities of daily living. A wheelchair will allow patient to safely perform daily activities. Patient is not able to propel themselves in the home using a standard weight wheelchair due to weakness:. Patient can self propel in the lightweight wheelchair. Length of need lifetime 99 months. Accessories: elevating leg rests back cushion, wheel locks, extensions and anti-tippers.   11/11/18 1620   11/09/18 1355  For home use only DME Hospital bed  Once    Question Answer Comment  Length of Need 6 Months   Patient has (list medical condition): Hip fracture   The above medical condition requires: Patient requires the ability to reposition frequently   Head must be elevated greater than: 30 degrees   Bed type Semi-electric      11/09/18 1355           DISCHARGE INSTRUCTIONS:    Follow with Ortho in 1-2 weeks.  If you experience worsening of your admission symptoms, develop shortness of breath, life threatening emergency, suicidal or homicidal thoughts you must seek medical attention immediately by calling 911 or calling your MD immediately  if symptoms less severe.  You Must read complete instructions/literature along with all the possible adverse reactions/side effects for all the Medicines you take and that have been prescribed to you. Take any new Medicines after you have completely understood and accept all the possible adverse reactions/side effects.   Please note  You were cared for by a hospitalist during your hospital stay. If you have any questions about your discharge medications or the care you received while you were in the hospital after you are discharged, you can call the unit and asked to speak with the hospitalist on call if the hospitalist that took care of you is not available. Once you are discharged, your primary care physician will handle any further medical issues. Please note that NO REFILLS for any discharge  medications will be authorized once you are discharged, as it is imperative that you return to your primary care physician (or establish a relationship with a primary care physician if you do not have one) for your aftercare needs so that they can reassess your need for medications and monitor your lab values.    Today   CHIEF COMPLAINT:   Chief Complaint  Patient presents with  . Fall  . Hip Pain    HISTORY OF PRESENT ILLNESS:  Brittney Bean  is a 85 y.o. female lives alone had a fall at home.  Patient states that she stood up from a chair and turned and fell on right hip, t x-ray of right hip showed a right hip fracture admitting for the same.  Patient is very upset she lost her daughter a week ago and very tearful.  Patient blood pressure was very elevated when she came.  Says that she has no PCP and said         ''  Dr. Nickolas MadridJesus  ' is her doctor.  Patient says that she uses wheelchair.  Has any recent fever or cough.     VITAL SIGNS:  Blood pressure (!) 158/50, pulse (!) 105, temperature 98.2 F (36.8 C), temperature source Oral, resp. rate 18, height 5\' 4"  (1.626 m), weight 103 kg, SpO2 99 %.  I/O:    Intake/Output Summary (Last 24 hours) at 11/12/2018 1038 Last data filed at 11/12/2018 0834 Gross per 24 hour  Intake 550 ml  Output 200 ml  Net 350 ml    PHYSICAL EXAMINATION:   GENERAL:  85 y.o.-year-old patient lying in the bed with no acute distress.  EYES: Pupils equal, round, reactive to light and accommodation. No scleral icterus. Extraocular muscles intact.  HEENT: Head atraumatic, normocephalic. Oropharynx and nasopharynx clear.  NECK:  Supple, no jugular venous distention. No thyroid enlargement, no tenderness.  LUNGS: Normal breath sounds bilaterally, no wheezing, rales,rhonchi or crepitation. No use of accessory muscles of respiration.  CARDIOVASCULAR: S1, S2 normal. No murmurs, rubs, or gallops.  ABDOMEN: Soft, nontender, nondistended. Bowel sounds present. No  organomegaly or mass.  EXTREMITIES: No pedal edema, cyanosis, or clubbing.  NEUROLOGIC: Cranial nerves II through XII are intact. Muscle strength 3-4/5 in all extremities except for the right lower extremity where she has pain on movement. Sensation intact. Gait not checked.  PSYCHIATRIC: The patient is drowsy but arousable.  SKIN: No obvious rash, lesion, or ulcer  DATA REVIEW:   CBC Recent Labs  Lab 11/12/18 0455  WBC 10.6*  HGB 8.1*  HCT 24.6*  PLT 178    Chemistries  Recent Labs  Lab 11/08/18 1556  11/11/18 0453  NA 139   < > 133*  K 4.4   < > 4.0  CL 109   < > 106  CO2 22   < > 23  GLUCOSE 212*   < > 127*  BUN 19   < > 25*  CREATININE 0.92   < > 0.74  CALCIUM 8.7*   < > 7.7*  AST 22  --   --   ALT 10  --   --   ALKPHOS 95  --   --   BILITOT 0.8  --   --    < > = values in this interval not displayed.    Cardiac Enzymes No results for input(s): TROPONINI in the last 168 hours.  Microbiology Results  Results for orders placed or performed during the hospital encounter of 11/08/18  SARS Coronavirus 2 (CEPHEID - Performed in [facility] lab), Hosp Order     Status: None   Collection Time: 11/08/18  4:01 PM   Specimen: Nasopharyngeal Swab  Result Value Ref Range Status   SARS Coronavirus 2 NEGATIVE NEGATIVE Final    Comment: (NOTE) If result is NEGATIVE SARS-CoV-2 target nucleic acids are NOT DETECTED. The SARS-CoV-2 RNA is generally detectable in upper and lower  respiratory specimens during the acute phase of infection. The lowest  concentration of SARS-CoV-2 viral copies this assay can detect is 250  copies / mL. A negative result does not preclude SARS-CoV-2 infection  and should not be used as the sole basis for treatment or other  patient management decisions.  A negative result may occur with  improper specimen collection / handling, submission of specimen other  than nasopharyngeal swab, presence of viral mutation(s) within the  areas  targeted by this assay, and inadequate number of viral copies  (<250 copies / mL). A negative  result must be combined with clinical  observations, patient history, and epidemiological information. If result is POSITIVE SARS-CoV-2 target nucleic acids are DETECTED. The SARS-CoV-2 RNA is generally detectable in upper and lower  respiratory specimens dur ing the acute phase of infection.  Positive  results are indicative of active infection with SARS-CoV-2.  Clinical  correlation with patient history and other diagnostic information is  necessary to determine patient infection status.  Positive results do  not rule out bacterial infection or co-infection with other viruses. If result is PRESUMPTIVE POSTIVE SARS-CoV-2 nucleic acids MAY BE PRESENT.   A presumptive positive result was obtained on the submitted specimen  and confirmed on repeat testing.  While 2019 novel coronavirus  (SARS-CoV-2) nucleic acids may be present in the submitted sample  additional confirmatory testing may be necessary for epidemiological  and / or clinical management purposes  to differentiate between  SARS-CoV-2 and other Sarbecovirus currently known to infect humans.  If clinically indicated additional testing with an alternate test  methodology [id number]) is advised. The SARS-CoV-2 RNA is generally  detectable in upper and lower respiratory sp ecimens during the acute  phase of infection. The expected result is Negative. Fact Sheet for Patients:  BoilerBrush.com.cyhttps://www.fda.gov/media/136312/download Fact Sheet for Healthcare Providers: https://pope.com/https://www.fda.gov/media/136313/download This test is not yet approved or cleared by the [place] FDA and has been authorized for detection and/or diagnosis of SARS-CoV-2 by FDA under an Emergency Use Authorization (EUA).  This EUA will remain in effect (meaning this test can be used) for the duration of the COVID-19 declaration under Section 564(b)(1) of the Act, 21 U.S.C. section  360bbb-3(b)(1), unless the authorization is terminated or revoked sooner. Performed at [facility], [address] [place], [place] [address]   Surgical PCR screen     Status: None   Collection Time: 11/09/18  6:08 AM   Specimen: Nasal Mucosa; Nasal Swab  Result Value Ref Range Status   MRSA, PCR NEGATIVE NEGATIVE Final   Staphylococcus aureus NEGATIVE NEGATIVE Final    Comment: (NOTE) The Xpert SA Assay (FDA approved for NASAL specimens in patients 83 years of age and older), is one component of a comprehensive surveillance program. It is not intended to diagnose infection nor to guide or monitor treatment. Performed at [facility], [address] [place], [place] [address]     RADIOLOGY:  No results found.  EKG:   Orders placed or performed during the hospital encounter of 11/08/18  . ED EKG  . ED EKG  . EKG 12-Lead  . EKG 12-Lead      Management plans discussed with the patient, family and they are in agreement.  CODE STATUS:     Code Status Orders  (From admission, onward)         Start     Ordered   11/08/18 1739  Do not attempt resuscitation (DNR)  Continuous    Question Answer Comment  In the event of cardiac or respiratory ARREST Do not call a "code blue"   In the event of cardiac or respiratory ARREST Do not perform Intubation, CPR, defibrillation or ACLS   In the event of cardiac or respiratory ARREST Use medication by any route, position, wound care, and other measures to relive pain and suffering. May use oxygen, suction and manual treatment of airway obstruction as needed for comfort.   Comments Nurse may pronounce.      11/08/18 1739        Code Status History  Date Active Date Inactive Code Status Order ID Comments User Context   11/08/2018 1738 11/08/2018 1739 Full Code [id number]  Epifanio Lesches, MD ED   Advance Care Planning Activity      TOTAL TIME TAKING CARE OF THIS PATIENT: 35 minutes.    Vaughan Basta M.D on 11/12/2018 at 10:38 AM  Between 7am to 6pm - Pager - [phone]  After 6pm go to [email or web address] - password [email or web address] [facility]  Office  [phone]  CC: Primary care physician; Patient, No Pcp Per   Note: This dictation was prepared with Dragon dictation along with smaller phrase technology. Any transcriptional errors that result from this process are unintentional.

## 2018-11-12 NOTE — Progress Notes (Signed)
Physical Therapy Treatment Patient Details Name: Brittney Bean MRN: [id number] DOB: 08-11-1925 Today's Date: 11/12/2018    History of Present Illness Brittney Bean is a 58yoF presented to ED secondary to fall in home environment, acute onset of R hip pain; admitted for management of R intertrochanteric hip fracture, s/p IM nailing 11/09/18, WBAT.    PT Comments    Pt received in bed awake, naked but under blankets. Pt agreeable to be assisted with donning of gown. Pt awake all of session, flat affect with some delayed processing time and is oriented to situation, but has difficulty attending to task during exercises. Total A to and from EOB, but once pain levels out, the patient is able to tolerate sitting EOB independently without UE support x5 minutes, but ultimately is limited by nausea/dizziness.      Follow Up Recommendations  SNF;Other (comment)(family not agreeable to placement, elects to have patient return to home.)     Equipment Recommendations  Wheelchair (measurements PT);Wheelchair cushion (measurements PT);Hospital bed;Rolling walker with 5" wheels;3in1 (PT)    Recommendations for Other Services       Precautions / Restrictions Precautions Precautions: Fall Restrictions RLE Weight Bearing: Weight bearing as tolerated    Mobility  Bed Mobility Overal bed mobility: Needs Assistance Bed Mobility: Supine to Sit;Sit to Supine     Supine to sit: Total assist;+2 for physical assistance Sit to supine: Total assist;+2 for physical assistance   General bed mobility comments: Pt puts forth effort to assist to EOB but unable to provide much help. Pt is more limited moving back to supine, not able to help at all.(Pt tolerates sitting tall at EOB x 5+ minutes once her pain settles down. Reports nausea and dizziness.)  Transfers                 General transfer comment: not appropriate to try at this time;  Ambulation/Gait                 Stairs              Wheelchair Mobility    Modified Rankin (Stroke Patients Only)       Balance Overall balance assessment: Needs assistance Sitting-balance support: No upper extremity supported;Feet supported Sitting balance-Leahy Scale: Good                                      Cognition Arousal/Alertness: Awake/alert Behavior During Therapy: Flat affect Overall Cognitive Status: No family/caregiver present to determine baseline cognitive functioning                                 General Comments: interactive with some delayed response time, delyaed processing time, oriented to situation, but ability to attend to task is limited. Following cues for exercise is also limited at times.      Exercises General Exercises - Lower Extremity Ankle Circles/Pumps: AROM;AAROM;Both;15 reps;Supine;Limitations Ankle Circles/Pumps Limitations: difficulty attending to take, does not perform more than 4 reps independently or count reps without continued intermittent cues. Short Arc Quad: AAROM;AROM;Both;10 reps;Supine;Limitations Short Arc Quad Limitations: difficulty attending to take, does not perform more than 4 reps independently or count reps without continued intermittent cues. Largely unsuccessful on Right Heel Slides: AAROM;Both;Supine;10 reps;Limitations Heel Slides Limitations: difficulty attending to take, does not perform more than 4 reps independently or count reps without continued  intermittent cues. Hip ABduction/ADduction: 15 reps;Supine;AAROM;Limitations Hip Abduction/Adduction Limitations: difficulty attending to take, does not perform more than 4 reps independently or count reps without continued intermittent cues.    General Comments        Pertinent Vitals/Pain Pain Assessment: 0-10 Pain Score: 6  Pain Location: R hip at rest Pain Intervention(s): Limited activity within patient's tolerance;Monitored during session;Patient requesting pain meds-RN  notified;RN gave pain meds during session;Relaxation    Home Living                      Prior Function            PT Goals (current goals can now be found in the care plan section) Acute Rehab PT Goals Patient Stated Goal: agreeable to session PT Goal Formulation: With patient Time For Goal Achievement: 11/25/18 Potential to Achieve Goals: Fair Progress towards PT goals: Not progressing toward goals - comment    Frequency    7X/week      PT Plan Current plan remains appropriate    Co-evaluation              AM-PAC PT "6 Clicks" Mobility   Outcome Measure  Help needed turning from your back to your side while in a flat bed without using bedrails?: Total Help needed moving from lying on your back to sitting on the side of a flat bed without using bedrails?: Total Help needed moving to and from a bed to a chair (including a wheelchair)?: Total Help needed standing up from a chair using your arms (e.g., wheelchair or bedside chair)?: Total Help needed to walk in hospital room?: Total Help needed climbing 3-5 steps with a railing? : Total 6 Click Score: 6    End of Session Equipment Utilized During Treatment: Gait belt Activity Tolerance: Patient limited by fatigue Patient left: in bed;with call bell/phone within reach;with bed alarm set;with nursing/sitter in room Nurse Communication: Mobility status PT Visit Diagnosis: Difficulty in walking, not elsewhere classified (R26.2);Muscle weakness (generalized) (M62.81);Pain Pain - Right/Left: Right Pain - part of body: Hip     Time: [id number] PT Time Calculation (min) (ACUTE ONLY): 32 min  Charges:  $Therapeutic Exercise: 23-37 mins                     12:14 PM, 11/12/18 Brittney LintsAllan C Zarius Bean, PT, DPT Physical Therapist - [facility]  [phone] (ASCOM)    Brittney Bean C 11/12/2018, 12:09 PM

## 2018-11-12 NOTE — TOC Transition Note (Signed)
Transition of Care [facility]) - CM/SW Discharge Note   Patient Details  Name: Brittney Bean MRN: [id number] Date of Birth: Jan 01, 1926  [facility] CM/SW Contact:  Su Hilt, RN Phone Number: 11/12/2018, 12:00 PM   Clinical Narrative:    Patient to discharge home today by EMS transport to be called by bed side nurse once ready Daughter [name] was called and made aware of the DC, she requested to be called when EMS gets her so she can be at the home once the patient arrives.  I notified the nurse.  The patient has a BSC, WC, Hospital bed at home and a Harrel Lemon lift will be delivered today.  [facility] is set up for Home health and [place] is aware of the DC The daughter [name] states that they have several family members that are nurses and CNAs that will be taking care of the patient at all times, I made her aware that the patient is a 2+ assist and she can't feed herself, she stated understanding   Final next level of care: Home w Home Health Services Barriers to Discharge: Barriers Resolved   Patient Goals and CMS Choice Patient states their goals for this hospitalization and ongoing recovery are:: to go home per daughter CMS Medicare.gov Compare Post Acute Care list provided to:: Patient Represenative (must comment)(daughter [name]) Choice offered to / list presented to : Adult Children  Discharge Placement                       Discharge Plan and Services   Discharge Planning Services: CM Consult Post Acute Care Choice: Home Health          DME Arranged: Hospital bed DME Agency: [facility] Date DME Agency Contacted: 11/09/18 Time DME Agency Contacted: [phone] Representative spoke with at DME Agency: [place]: PT, Nurse's Aide, RN [place] Agency: [facility] Date [place]: 11/09/18 Time [place]: [phone] Representative spoke with at [place]: [place]  Social Determinants of Health ([place]) Interventions     Readmission Risk  Interventions No flowsheet data found.

## 2018-11-12 NOTE — TOC Progression Note (Signed)
Transition of Care [facility]) - Progression Note    Patient Details  Name: Brittney Bean MRN: [id number] Date of Birth: 02/09/1926  [facility]) CM/SW Contact  Su Hilt, RN Phone Number: 11/12/2018, 9:09 AM  Clinical Narrative:    [name] called and stated that she really wants her mother home today, the bed was delivered they have a WC and a BSC, the hoyer lift will be delivered, the patient will DC home via EMS today  Expected Discharge Plan: [place] Barriers to Discharge: Continued Medical Work up  Expected Discharge Plan and Services Expected Discharge Plan: [place]   Discharge Planning Services: CM Consult Post Acute Care Choice: [place] arrangements for the past 2 months: Single Family Home Expected Discharge Date: 11/11/18               DME Arranged: Hospital bed DME Agency: [facility] Date DME Agency Contacted: 11/09/18 Time DME Agency Contacted: [phone] Representative spoke with at DME Agency: [place] HH Arranged: PT, Nurse's Aide, RN [place] Agency: [facility] Date [place]: 11/09/18 Time [place]: [place] Representative spoke with at [place]: [place] ([place]) Interventions    Readmission Risk Interventions No flowsheet data found.

## 2020-07-03 DEATH — deceased

## 2020-11-11 IMAGING — DX RIGHT FEMUR PORTABLE 2 VIEW
4 series · 4 of 4 positions shown · non-contrast
Comparison: 11/08/2018.

CLINICAL DATA: Status post right proximal femur fracture ORIF.

EXAM:
RIGHT FEMUR PORTABLE 2 VIEW

[femur ap (1 of 2)]
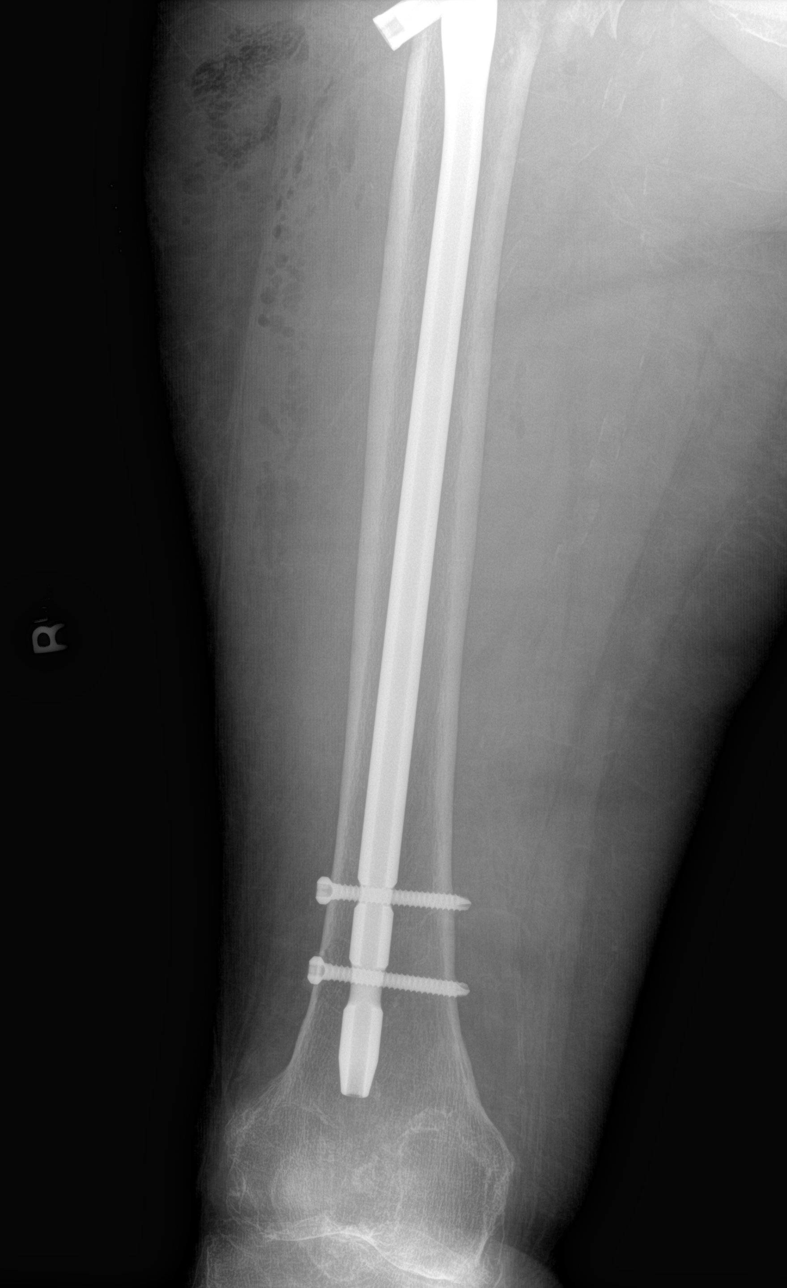

[femur ap (2 of 2)]
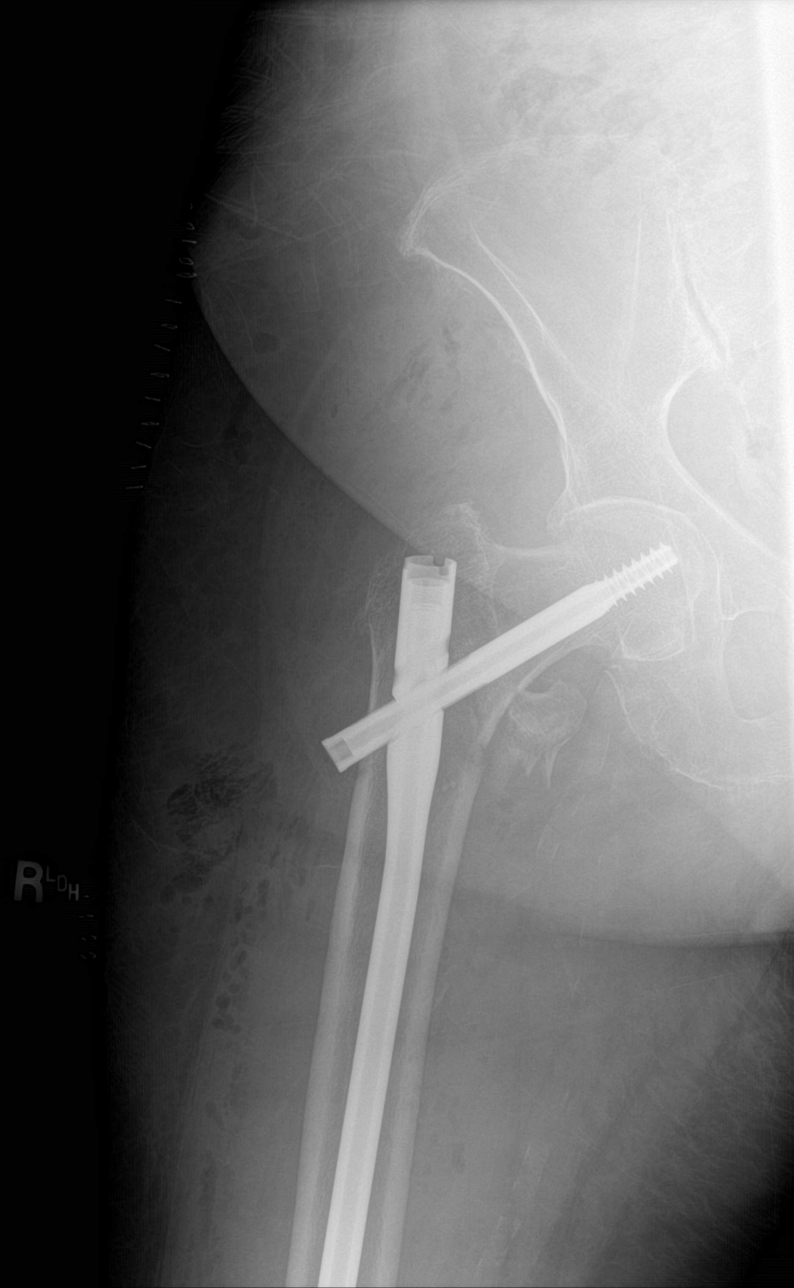

[femur lat (1 of 2)]
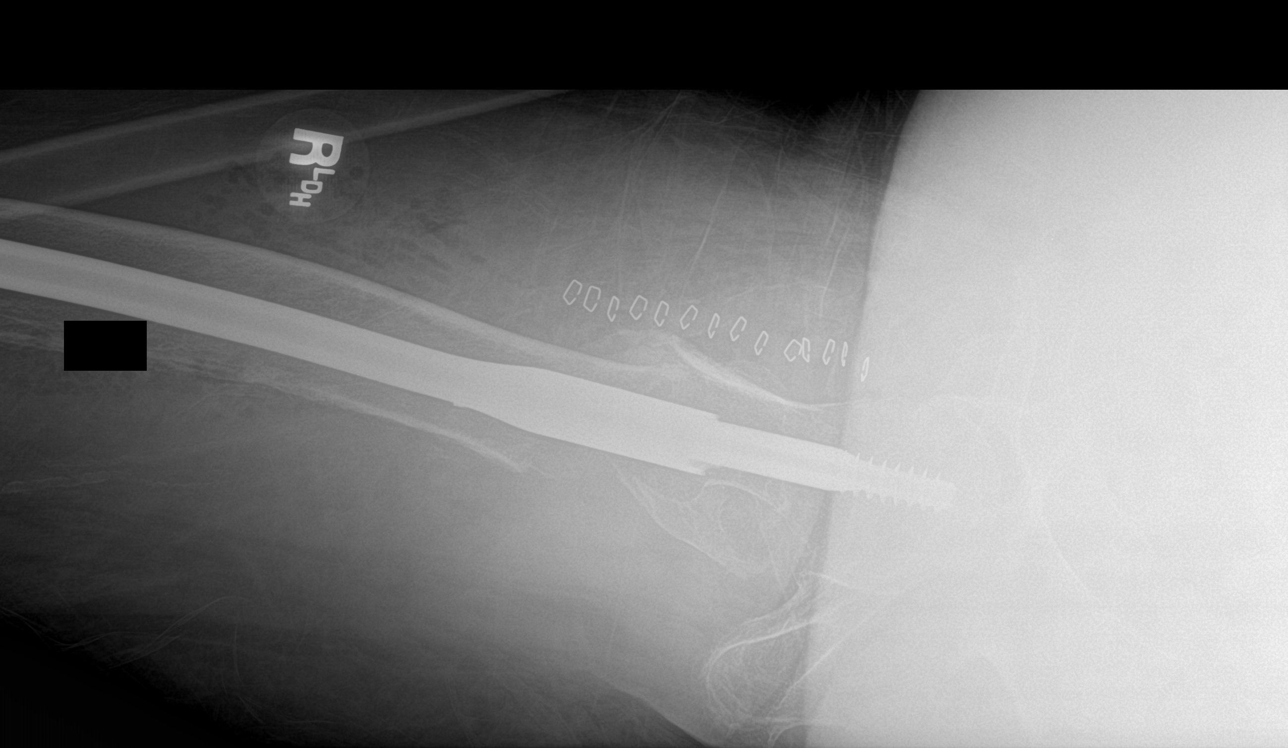

[femur lat (2 of 2)]
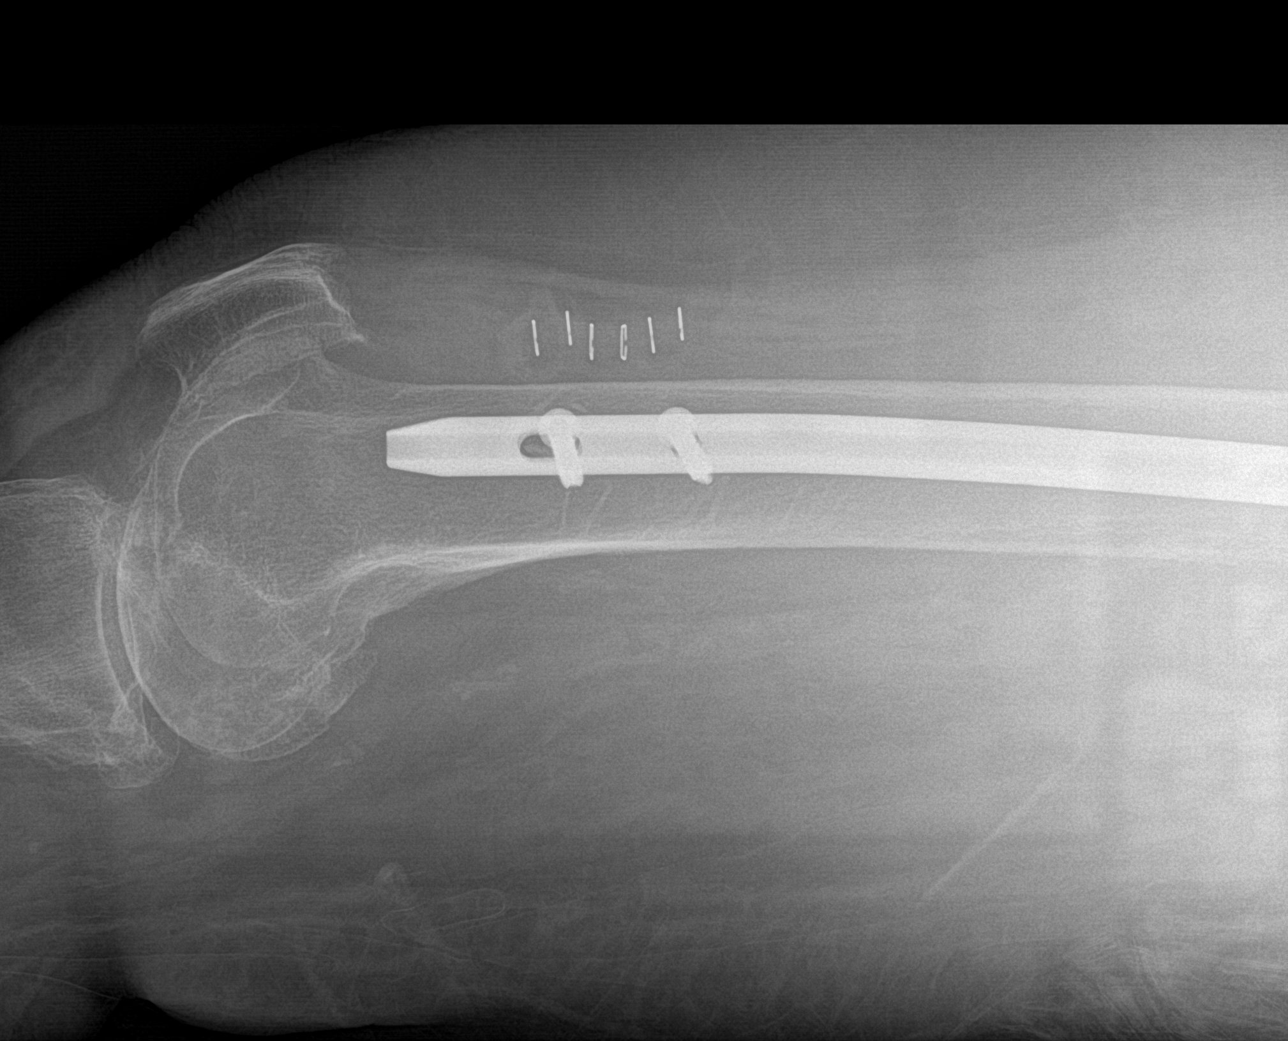

[4 of 4 positions shown; findings below may reference images not displayed]

FINDINGS: The intertrochanteric fracture has been reduced with a compression
screw and a long intramedullary rod. Primary fracture components are
in near anatomic alignment. The orthopedic hardware is well-seated.

No evidence of an acute fracture or operative complication.
IMPRESSION: Well aligned right femur intertrochanteric fracture following ORIF.
